# Patient Record
Sex: Male | Born: 1980 | Race: White | Hispanic: No | Marital: Single | Smoking: Current every day smoker
Health system: Southern US, Community
[De-identification: ages and names within clinical notes are randomized; demographics above are authoritative.]

## PROBLEM LIST (undated history)

## (undated) DIAGNOSIS — F329 Major depressive disorder, single episode, unspecified: Secondary | ICD-10-CM

## (undated) DIAGNOSIS — F32A Depression, unspecified: Secondary | ICD-10-CM

## (undated) DIAGNOSIS — F419 Anxiety disorder, unspecified: Secondary | ICD-10-CM

## (undated) HISTORY — DX: Anxiety disorder, unspecified: F41.9

---

## 2003-12-15 ENCOUNTER — Other Ambulatory Visit: Payer: Self-pay

## 2008-12-26 ENCOUNTER — Ambulatory Visit: Payer: Self-pay | Admitting: Internal Medicine

## 2009-08-15 ENCOUNTER — Inpatient Hospital Stay: Payer: Self-pay | Admitting: Unknown Physician Specialty

## 2009-12-26 ENCOUNTER — Emergency Department: Payer: Self-pay | Admitting: Emergency Medicine

## 2011-04-09 ENCOUNTER — Emergency Department: Payer: Self-pay | Admitting: Emergency Medicine

## 2012-12-22 DIAGNOSIS — N411 Chronic prostatitis: Secondary | ICD-10-CM | POA: Insufficient documentation

## 2012-12-22 DIAGNOSIS — K4090 Unilateral inguinal hernia, without obstruction or gangrene, not specified as recurrent: Secondary | ICD-10-CM

## 2012-12-22 DIAGNOSIS — Q602 Renal agenesis, unspecified: Secondary | ICD-10-CM | POA: Insufficient documentation

## 2012-12-22 HISTORY — DX: Unilateral inguinal hernia, without obstruction or gangrene, not specified as recurrent: K40.90

## 2013-01-05 ENCOUNTER — Inpatient Hospital Stay: Payer: Self-pay | Admitting: Psychiatry

## 2013-01-05 LAB — DRUG SCREEN, URINE
Amphetamines, Ur Screen: NEGATIVE (ref ?–1000)
Barbiturates, Ur Screen: NEGATIVE (ref ?–200)
Cannabinoid 50 Ng, Ur ~~LOC~~: POSITIVE (ref ?–50)
Cocaine Metabolite,Ur ~~LOC~~: NEGATIVE (ref ?–300)
Methadone, Ur Screen: NEGATIVE (ref ?–300)
Opiate, Ur Screen: NEGATIVE (ref ?–300)
Phencyclidine (PCP) Ur S: NEGATIVE (ref ?–25)
Tricyclic, Ur Screen: NEGATIVE (ref ?–1000)

## 2013-01-05 LAB — COMPREHENSIVE METABOLIC PANEL
Albumin: 4.1 g/dL (ref 3.4–5.0)
Alkaline Phosphatase: 79 U/L (ref 50–136)
Bilirubin,Total: 0.4 mg/dL (ref 0.2–1.0)
Co2: 27 mmol/L (ref 21–32)
Glucose: 112 mg/dL — ABNORMAL HIGH (ref 65–99)
Osmolality: 276 (ref 275–301)
SGPT (ALT): 22 U/L (ref 12–78)
Sodium: 139 mmol/L (ref 136–145)
Total Protein: 8.1 g/dL (ref 6.4–8.2)

## 2013-01-05 LAB — URINALYSIS, COMPLETE
Bilirubin,UR: NEGATIVE
Glucose,UR: NEGATIVE mg/dL (ref 0–75)
Hyaline Cast: 6
Ketone: NEGATIVE
Nitrite: NEGATIVE
Ph: 5 (ref 4.5–8.0)
Protein: NEGATIVE
RBC,UR: 2 /HPF (ref 0–5)
Squamous Epithelial: NONE SEEN
WBC UR: 6 /HPF (ref 0–5)

## 2013-01-05 LAB — ETHANOL: Ethanol: 127 mg/dL

## 2013-01-05 LAB — CBC
HGB: 17.1 g/dL (ref 13.0–18.0)
Platelet: 182 10*3/uL (ref 150–440)
WBC: 6.5 10*3/uL (ref 3.8–10.6)

## 2013-01-06 LAB — BEHAVIORAL MEDICINE 1 PANEL
Albumin: 3.5 g/dL (ref 3.4–5.0)
Alkaline Phosphatase: 62 U/L (ref 50–136)
Anion Gap: 4 — ABNORMAL LOW (ref 7–16)
BUN: 9 mg/dL (ref 7–18)
Basophil #: 0.1 10*3/uL (ref 0.0–0.1)
Basophil %: 0.9 %
Bilirubin,Total: 0.6 mg/dL (ref 0.2–1.0)
Calcium, Total: 8.5 mg/dL (ref 8.5–10.1)
Chloride: 107 mmol/L (ref 98–107)
Co2: 27 mmol/L (ref 21–32)
Creatinine: 0.84 mg/dL (ref 0.60–1.30)
EGFR (African American): >60
EGFR (Non-African Amer.): >60
Eosinophil #: 0.4 10*3/uL (ref 0.0–0.7)
Eosinophil %: 5.3 %
Glucose: 91 mg/dL (ref 65–99)
HCT: 46.4 % (ref 40.0–52.0)
HGB: 16.3 g/dL (ref 13.0–18.0)
Lymphocyte #: 2.5 10*3/uL (ref 1.0–3.6)
Lymphocyte %: 30.4 %
MCH: 32.6 pg (ref 26.0–34.0)
MCHC: 35.1 g/dL (ref 32.0–36.0)
MCV: 93 fL (ref 80–100)
Monocyte #: 0.5 x10 3/mm (ref 0.2–1.0)
Monocyte %: 6.6 %
Neutrophil #: 4.6 10*3/uL (ref 1.4–6.5)
Neutrophil %: 56.8 %
Osmolality: 274 (ref 275–301)
Platelet: 156 10*3/uL (ref 150–440)
Potassium: 3.6 mmol/L (ref 3.5–5.1)
RBC: 4.99 10*6/uL (ref 4.40–5.90)
RDW: 12.4 % (ref 11.5–14.5)
SGOT(AST): 19 U/L (ref 15–37)
SGPT (ALT): 19 U/L (ref 12–78)
Sodium: 138 mmol/L (ref 136–145)
Thyroid Stimulating Horm: 0.547 u[IU]/mL
Total Protein: 6.8 g/dL (ref 6.4–8.2)
WBC: 8.1 10*3/uL (ref 3.8–10.6)

## 2013-01-14 ENCOUNTER — Ambulatory Visit: Payer: Self-pay | Admitting: Psychiatry

## 2013-01-22 LAB — RAPID URINE DRUG SCREEN, HOSP PERFORMED
Amphetamines, Ur Screen: NEGATIVE (ref ?–1000)
Barbiturates, Ur Screen: NEGATIVE (ref ?–200)
Benzodiazepine, Ur Scrn: POSITIVE (ref ?–200)
Opiate, Ur Screen: NEGATIVE (ref ?–300)

## 2013-02-11 ENCOUNTER — Ambulatory Visit: Payer: Self-pay | Admitting: Psychiatry

## 2013-08-22 ENCOUNTER — Emergency Department: Payer: Self-pay | Admitting: Emergency Medicine

## 2013-10-29 DIAGNOSIS — R350 Frequency of micturition: Secondary | ICD-10-CM | POA: Insufficient documentation

## 2013-10-29 DIAGNOSIS — N50819 Testicular pain, unspecified: Secondary | ICD-10-CM | POA: Insufficient documentation

## 2013-10-30 DIAGNOSIS — Z3169 Encounter for other general counseling and advice on procreation: Secondary | ICD-10-CM | POA: Insufficient documentation

## 2014-08-14 ENCOUNTER — Emergency Department: Payer: Self-pay | Admitting: Emergency Medicine

## 2014-08-14 LAB — BASIC METABOLIC PANEL
ANION GAP: 10 (ref 7–16)
BUN: 8 mg/dL (ref 7–18)
Calcium, Total: 9.1 mg/dL (ref 8.5–10.1)
Chloride: 105 mmol/L (ref 98–107)
Co2: 23 mmol/L (ref 21–32)
Creatinine: 1.03 mg/dL (ref 0.60–1.30)
EGFR (African American): >60
EGFR (Non-African Amer.): >60
Glucose: 92 mg/dL (ref 65–99)
Osmolality: 274 (ref 275–301)
Potassium: 3.7 mmol/L (ref 3.5–5.1)
SODIUM: 138 mmol/L (ref 136–145)

## 2014-08-14 LAB — URINALYSIS, COMPLETE
BACTERIA: NONE SEEN
BILIRUBIN, UR: NEGATIVE
BLOOD: NEGATIVE
Glucose,UR: NEGATIVE mg/dL (ref 0–75)
Ketone: NEGATIVE
Leukocyte Esterase: NEGATIVE
Nitrite: NEGATIVE
PH: 7 (ref 4.5–8.0)
Protein: NEGATIVE
Specific Gravity: 1.006 (ref 1.003–1.030)
Squamous Epithelial: NONE SEEN
WBC UR: 2 /HPF (ref 0–5)

## 2014-08-14 LAB — CBC
HCT: 51.3 % (ref 40.0–52.0)
HGB: 17.6 g/dL (ref 13.0–18.0)
MCH: 32.6 pg (ref 26.0–34.0)
MCHC: 34.3 g/dL (ref 32.0–36.0)
MCV: 95 fL (ref 80–100)
Platelet: 201 10*3/uL (ref 150–440)
RBC: 5.4 10*6/uL (ref 4.40–5.90)
RDW: 12.2 % (ref 11.5–14.5)
WBC: 8.1 10*3/uL (ref 3.8–10.6)

## 2014-08-14 LAB — TROPONIN I

## 2015-02-03 NOTE — Consult Note (Signed)
PATIENT NAME:  Brett [name], Brett [name] MR#:  [id number] DATE OF BIRTH:  06/15/1981  DATE OF CONSULTATION:  01/05/2013  CONSULTING PHYSICIAN:  Rosangela Fehrenbach S. Garnetta BuddyFaheem, MD  REASON FOR CONSULTATION: Depression.   HISTORY OF PRESENT ILLNESS: The patient is a 34 year old white male who presented to the ED stating that he feels like he is going to self-destruct soon. The patient has Latimore history of using alcohol and he has been binge drinking for almost 12 years. He reported that he has been addicted to alcohol. He consumed 12 packs of beer per day. He reported that he has been using alcohol to control his depressive symptoms. He reported that he is a "high functioning alcoholic." He reported that when he does not drink, he starts getting shakes, stomach problem, and dehydration. 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  After he comes back home, he starts drinking again. The patient reported that he also has history of depression with symptoms including depressed mood, feelings of hopelessness, helplessness increased guilt, decreased energy, decreased concentration.  The patient reported that he was having suicidal thoughts, but was not having any plans. The patient reported that he was also using marijuana, using up to 2 joints per day for the past several years but he quit using marijuana one week ago. He has been smoking for the past 12 years. He decided that he needs to stand up for his children. The patient reported that he has 2 children, ages  35 months as well as a 35-year-old from his previous relationship. He decided that he needed to come in for help at this time. The patient reported that he does not have any history of blackouts, seizures as well as shakiness. He reported that he wants help at this time for his depressive symptoms as well as for his alcohol use. During his previous hospitalization he was diagnosed with bipolar disorder and was given medication, but he remains noncompliant.    PAST PSYCHIATRIC HISTORY: The patient reported that he was admitted to the [facility] inpatient unit 2[facility] years ago. At that time he was given Wellbutrin, but he took the medication only for a couple of months. He was also referred to [facility] but he stated that he did not go there. The patient stated that he has never been to the rehab.   FAMILY HISTORY: The patient reported that his mother has history of bipolar disorder and she takes Zyprexa at this time. All his uncles are alcoholic. He denied any history of suicide in his family.   PAST MEDICAL HISTORY: The patient was born with 1 kidney. He reported that he gets hives. He has responded to Zyprexa in the past.   SOCIAL HISTORY: The patient has completed 2 years of college. He currently lives with his fiance and 2 children. He has his daughter who is 35 years old from the first relationship. He is also has a 5172-month-old. He has been working at [employer] for the past couple of years and works for 60+ hours. He feels that he enjoys his job. He has recently stopped using marijuana. He smokes one half to 1 pack of cigarettes per day.    REVIEW OF SYSTEMS:   CONSTITUTIONAL: No fever or chills. No weight changes.  EYES: No blurred or double vision.  RESPIRATORY: No shortness of breath or cough.  CARDIOVASCULAR: No chest pain, orthopnea.  GASTROINTESTINAL: No abdominal pain, nausea, vomiting or diarrhea.  GENITOURINARY: No incontinence or frequency.  ENDOCRINE: No heat or cold intolerance.  LYMPHATIC: No anemia or easy bruising.  INTEGUMENTARY: No acne or rash.  MUSCULOSKELETAL: No muscle or joint pain.   MENTAL STATUS EXAMINATION: The patient is a moderately built male who appeared his stated age. He was calm and cooperative. He maintained fair eye contact. His mood was depressed. Affect was congruent. Thought process was logical, goal-directed. Thought content was non-delusional. He currently denied having any suicidal ideations or plans.  VITAL SIGNS:   Temperature 98.2, pulse 91, respirations 20, blood pressure 144/63.  ANCILLARY DATA:  Glucose 112, BUN 7, creatinine 1.04, sodium 139, potassium 3.6, chloride 106, bicarbonate 27, anion gap 6, blood alcohol level 127, protein 8.1, albumin 4.1, bilirubin 0.4, AST 17, ALT 22, TSH 1.5.  Urine drug screen positive for benzodiazepines and cannabinoids. WBC 6.5, RBC 5.26, hemoglobin 17.1, MCV 93, MCH 32.6.   DIAGNOSTIC IMPRESSION: AXIS I:   Bipolar disorder, not otherwise specified.    Alcohol dependence and alcohol withdrawal.  AXIS II:  None.  AXIS III:  The patient has one kidney.  AXIS IV:  Severe. AXIS V: Current global assessment of functioning: 25.  TREATMENT PLAN: 1.  The patient is willing to be admitted voluntarily for the treatment of depression and alcohol 2.  withdrawal.  2.  I will start him on CIWA protocol.  3.  He will also be given medications for depression once he becomes clinically stable. 4.  The patient agreed with the plan. He is willing to take any medication to control his mood symptoms.  5.  Treatment team to follow.   Thank you for allowing me to participate in the care of this patient.     ____________________________ Ardeen Fillers. Garnetta Buddy, MD usf:ct D: 01/05/2013 14:22:57 ET T: 01/05/2013 14:31:48 ET JOB#: [id number]  cc: Ardeen Fillers. Garnetta Buddy, MD, <Dictator> Rhunette Croft MD ELECTRONICALLY SIGNED 01/14/2013 8:50

## 2015-02-03 NOTE — Discharge Summary (Signed)
PATIENT NAME:  Brett [name], Brett [name] MR#:  [id number] DATE OF BIRTH:  05/10/1981  DATE OF ADMISSION:  01/05/2013 ANTICIPATED DISCHARGE:  01/08/2013  HOSPITAL COURSE:  See dictated history and physical for details of admission. This is a 34 year old man with a history of alcohol dependence and bipolar disorder presented himself to the hospital requesting detox, giving a history of having been drinking very heavily nonstop for months at a time and really never having had any sustained sobriety. Additionally, he has a Tomer history of what sounds like an almost constant hypomania, but with some ups and downs to it. The patient felt like his life was out of control and needed some assistance. In the hospital, he was treated for alcohol withdrawal and really showed minimal to no signs of serious alcohol withdrawal. He had no tremor. His vital signs remained largely stable. There was no sign of delirium. He was restarted on Depakote 750 mg at night because of his previous treatment for this for his diagnosis of bipolar disorder. He tolerated this medicine well with no difficulty and is agreeable to continuing with that. We have made arrangements for him to be accepted into a [facility] substance abuse treatment facility in [place]. I have completed short-term disability paperwork for the patient to allow him to get some time off work. The patient has met with his family and discussed this plan with them. He appears to be sincere about it. He has gone to groups today and participated well. At this time we will plan to continue the current medicine but discharge him tomorrow to be transported by his family to [place]. Education done regarding his diagnoses and appropriate treatment and patient understands and is agreeable to treatment.   DISCHARGE MEDICATIONS:  Depakote 750 mg p.o. at bedtime.   LABORATORY RESULTS:  Admission labs showed drug screen positive for benzodiazepines and cannabis. TSH normal at 1.3, alcohol 127.  Chemistry showed elevated glucose 112, otherwise normal with normal liver function tests. CBC was entirely normal. Urinalysis 1+ leukocyte esterase, mixed cells.   MENTAL STATUS EXAM AT DISCHARGE:  Neatly dressed and groomed man looks his stated age, pleasant and cooperative in the interview. Good eye contact, normal psychomotor activity. Speech slightly loud and slightly elevated in rate, but not pressured. Thoughts appear lucid. No evidence of loosening of associations or delusional thinking. Denies auditory or visual hallucinations. Denies suicidal or homicidal ideation. Short and Spellman-term memory grossly intact. Insight improved. Judgment improved.  Intelligence normal.   DISPOSITION:  Discharge tomorrow to follow up with [place] [place] substance abuse program.   DIAGNOSIS PRINCIPAL AND PRIMARY:   AXIS I:  Alcohol dependence.   SECONDARY DIAGNOSIS: AXIS I: Bipolar disorder type II.  AXIS II:  No diagnosis.   AXIS III:  History of prostatitis, alcohol withdrawal.   AXIS IV:  Moderate from the effect of his drinking on his life.   AXIS V:  Functioning at time of discharge 60.     ____________________________ Gonzella Lex, MD jtc:ce D: 01/07/2013 16:42:28 ET T: 01/07/2013 17:10:50 ET JOB#: [id number]  cc: Gonzella Lex, MD, <Dictator> Gonzella Lex MD ELECTRONICALLY SIGNED 01/11/2013 0:34

## 2015-02-03 NOTE — H&P (Signed)
DATE OF BIRTH:  21-Nov-1980  DATE OF EVALUATION: 01/06/2013  IDENTIFYING INFORMATION AND CHIEF COMPLAINT: A 34 year old man with a history of alcohol dependence and bipolar disorder, came to the hospital seeking detox.   CHIEF COMPLAINT:  "I just was fed up with it."   HISTORY OF PRESENT ILLNESS:  Information obtained from the patient and the chart. The patient reports that he has been drinking heavily for years, at a rate often of 18 or more beers or the equivalent per day. He also smokes marijuana frequently. He is aware that it has been disrupting his life, ruining his relationship with his ex-wife, making it difficult for him to function socially, making his mood more labile, and he has decided that he wants to stop. On top of that, the patient has a diagnosis of bipolar disorder, which appears to be separate from his substance abuse. He says that he is aware that he has major seasonal swings in his mood with frequent periods of hypomania, during which he can engage in disruptive behavior, and he wants to get something done about that as well. He denies that he is having any suicidal ideation. Denies homicidal ideation. He is not reporting acute psychotic symptoms. The patient had not been getting any kind of outpatient mental health treatment. Major stresses in his life include going through a current divorce with a custody battle, which is very unpleasant for him.   PAST PSYCHIATRIC HISTORY:  One previous psychiatric hospitalization at our hospital in 2010, at which time he was diagnosed with bipolar disorder. He was started on Depakote, but did not continue to take it after leaving the hospital. He also did not maintain any kind of sobriety after that hospitalization. Denies any history of suicide attempts or interpersonal violence.   PAST MEDICAL HISTORY: The patient does not report any significant ongoing medical problems other than prostatitis, which is currently completely under control and  which he does not get any treatment for her.   SOCIAL HISTORY:  Lives with his fiance and a 18-year-old child. He has another child who is age 77, who he shares custody of with his ex-wife. They are going through a current legal custody battle, apparently. 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   FAMILY HISTORY:  He says there are several people in his family with bipolar disorder and substance abuse problems.   SUBSTANCE ABUSE HISTORY:  Has been drinking heavily for many years. He has no history of seizures or delirium tremens. Also smokes marijuana very frequently, last used about 10 days ago. No history of sustained sobriety or engaging in any kind of outpatient treatment.   REVIEW OF SYSTEMS:  Mood is feeling anxious. Denies hallucinations. Denies suicidal or homicidal ideation. Not currently having tremors. Mood is feeling adequate. No suicidal ideation. No psychotic symptoms.   CURRENT MEDICATIONS:  None.   ALLERGIES:  PENICILLIN.   MENTAL STATUS EXAM:  A casually dressed, neatly groomed man who looks his stated age. Cooperative and pleasant in the interview. Good eye contact, normal psychomotor activity. Speech is normal in rate, tone and volume. Affect euthymic, reactive and appropriate. Mood stated as being better. Thoughts are lucid, without loosening of associations. No evidence of delusional thinking. Denies auditory or visual hallucinations. Denies suicidal or homicidal ideation. Judgment and insight are currently intact. Short and Dyches-term memory grossly intact.   PHYSICAL EXAMINATION: GENERAL:  The patient does not appear to be in any acute distress.  SKIN:  No skin lesions identified.  HEENT:  Pupils equal and reactive. Face symmetric.  MUSCULOSKELETAL: Full range of motion at all extremities. Gait normal. Strength and reflexes normal throughout.  LUNGS:  Clear, with no wheezes.  HEART:  Regular rate and rhythm.  ABDOMEN:  Soft, nontender, normal  bowel sounds.  VITAL SIGNS:  Most recent blood pressure 134/81, respirations 18, pulse 79, temperature 98.2.   LABORATORY RESULTS:  Admission labs showed a drug screen positive for cannabis and benzodiazepines. TSH normal at 1.5. Alcohol level on admission 127. Glucose slightly elevated at 112, but the rest of the chemistry is normal. CBC normal. Urinalysis unremarkable.   ASSESSMENT:  A 34 year old man with alcohol dependence and bipolar disorder, currently presenting hypomanic but not psychotic, and in need of alcohol withdrawal.   TREATMENT PLAN:  The patient is currently getting standing doses of Librium. These will be tapered. Meanwhile, he will be engaged in substance abuse groups as well as other appropriate treatment programming. Daily individual counseling and education as well. We discussed medication for his bipolar disorder, and agreed to restart Depakote. The patient has been educated about the importance of actually staying on medication and staying active in treatment, and he does express an understanding of this. He is already discussing with the treatment team possibilities for further care, including the intensive outpatient program or going to a rehab program.   DIAGNOSIS, PRINCIPAL AND PRIMARY:  AXIS I:  Alcohol dependence.   SECONDARY DIAGNOSES: AXIS I: Bipolar disorder, type II, hypomanic.  Marijuana abuse.   AXIS II:  Deferred.   AXIS III:  Prostatitis, in remission.   AXIS IV:  Moderate stress from his divorce.   AXIS V:  Functioning at time of evaluation:  40.   ____________________________ Audery AmelJohn T. Eiko Mcgowen, MD jtc:mr D: 01/06/2013 19:17:53 ET T: 01/06/2013 19:39:04 ET JOB#: [id number]  cc: Audery AmelJohn T. Maizee Reinhold, MD, <Dictator> Audery AmelJOHN T Tyreak Reagle MD ELECTRONICALLY SIGNED 01/06/2013 23:32

## 2015-02-09 ENCOUNTER — Ambulatory Visit
Admit: 2015-02-09 | Disposition: A | Discharge status: Home / Self Care | Payer: Self-pay | Attending: Family Medicine | Admitting: Family Medicine

## 2015-02-09 LAB — RAPID STREP-A WITH REFLX: Micro Text Report: POSITIVE

## 2015-04-13 ENCOUNTER — Encounter: Payer: Self-pay | Admitting: Emergency Medicine

## 2015-04-13 ENCOUNTER — Ambulatory Visit
Admission: EM | Admit: 2015-04-13 | Discharge: 2015-04-13 | Disposition: A | Discharge status: Home / Self Care | Payer: Self-pay | Attending: Emergency Medicine | Admitting: Emergency Medicine

## 2015-04-13 DIAGNOSIS — L03115 Cellulitis of right lower limb: Secondary | ICD-10-CM

## 2015-04-13 DIAGNOSIS — J069 Acute upper respiratory infection, unspecified: Secondary | ICD-10-CM

## 2015-04-13 MED ORDER — MUPIROCIN CALCIUM 2 % NA OINT: TOPICAL_OINTMENT | NASAL | Status: DC

## 2015-04-13 MED ORDER — FLUTICASONE PROPIONATE 50 MCG/ACT NA SUSP: 2.0000 | Freq: Every day | NASAL | Status: DC

## 2015-04-13 MED ORDER — DOXYCYCLINE HYCLATE 100 MG PO CAPS: 100.0000 mg | ORAL_CAPSULE | Freq: Two times a day (BID) | ORAL | Status: DC

## 2015-04-13 MED ORDER — PSEUDOEPHEDRINE-GUAIFENESIN ER 120-1200 MG PO TB12: 1.0000 | ORAL_TABLET | Freq: Two times a day (BID) | ORAL | Status: DC

## 2015-04-13 NOTE — Discharge Instructions (Signed)
Take the medication as written. Take 1 gram of tylenol with  800 mg motrin up to 3 times a day as needed for pain and fever. This is an effective combination. Drink extra fluids. Start taking the mucinex to keep the mucus secretions thin. Use a neti pot or the NeilMed sinus rinse as often as you want to to reduce nasal congestion. Follow the directions on the box.  Go to www.goodrx.com to look up your medications. This will give you a list of where you can find your prescriptions at the most affordable prices.   Apply the Bactroban in your nose to eliminate any MRSA that might be living there. This is not completely necessary, doxycycline is the most important prescription for your abscesses. Use Hibiclens/chlorhexidine soap to wash the areas that are infected. Return if you get worse, have a persistent fever >100.4, or for any concerns.

## 2015-04-13 NOTE — ED Notes (Signed)
Pt reports cold symptoms, nasal congestion and drainage, sore throat started yesterday afternoon. Abscesses x2 on R thigh started about 3 days ago. And another abscess on on R buttock is going away.

## 2015-04-13 NOTE — ED Provider Notes (Signed)
HPI  SUBJECTIVE:  Brett [name] is a 34 y.o. male who presents with 2 complaints: First, he reports nasal congestion with postnasal drip, sore throat, nonproductive cough starting yesterday. He thinks that this is a cold. Symptoms are better with Tylenol Cold no aggravating symptoms . he has not tried anything else for this. No fevers, chest pain, wheezing, shortness of breath, allergy symptoms. No sinus pain/pressure, purulent nasal drainage, dental pain, facial swelling. Patient states that he has a history of seasonal allergies, sinus infections.  Second, patient reports recurrent lesion/boils primarily on his lower extremities for the past month. He states it starts off as erythema, becomes pruritic, then becomes painful and "hard". States that it drains on its own around day 3, and completely resolves in a week. He denies nausea, vomiting, fevers, sensation being bitten at night, blood on the bed clothes in the morning, other trauma to these areas. No contacts with similar lesions. No aggravating or alleviating factors. He has not tried anything for this. Past medical history negative for diabetes, MRSA.    No past medical history on file.  No past surgical history on file.  History reviewed. No pertinent family history.  History  Substance Use Topics  . Smoking status: Current Every Day Smoker -- 1.00 packs/day    Types: Cigarettes  . Smokeless tobacco: Not on file  . Alcohol Use: Yes    No current facility-administered medications for this encounter.  Current outpatient prescriptions:  .  doxycycline (VIBRAMYCIN) 100 MG capsule, Take 1 capsule (100 mg total) by mouth 2 (two) times daily., Disp: 20 capsule, Rfl: 0 .  fluticasone (FLONASE) 50 MCG/ACT nasal spray, Place 2 sprays into both nostrils daily., Disp: 16 g, Rfl: 0 .  mupirocin nasal ointment (BACTROBAN) 2 %, Apply in each nostril daily x 5 days, Disp: 10 g, Rfl: 0 .  Pseudoephedrine-Guaifenesin (MUCINEX D) [phone]  MG TB12, Take 1 tablet by mouth 2 (two) times daily., Disp: 20 each, Rfl: 0  Allergies  Allergen Reactions  . Penicillins Other (See Comments)    Unknown, happened as a baby      ROS  As noted in HPI.   Physical Exam  BP 144/87 mmHg  Pulse 86  Temp(Src) 97.8 F (36.6 C) (Oral)  Resp 18  Ht  (1.753 m)  Wt 190 lb (86.183 kg)  BMI 28.05 kg/m2  SpO2 100%  Constitutional: Well developed, well nourished, no acute distress Eyes:  EOMI, conjunctiva normal bilaterally HENT: Normocephalic, atraumatic,mucus membranes moist.  Respiratory: Normal inspiratory effort Cardiovascular: Normal rate GI: nondistended skin: 9 x 10 cm area of erythema right medial upper thigh with a 2 x 3 cm area of induration with central scab noted. No Drainage no fluctuance. There is nothing to I&D. Musculoskeletal: no deformities Neurologic: Alert & oriented x 3, no focal neuro deficits Psychiatric: Speech and behavior appropriate   ED Course   Medications - No data to display  No orders of the defined types were placed in this encounter.    No results found for this or any previous visit (from the past 24 hour(s)). No results found.  ED Clinical Impression  URI (upper respiratory infection)  Cellulitis of right lower extremity  ED Assessment/Plan 1. Upper respiratory infection. Mucinex D, Flonase, saline nasal irrigation. No indications for antibiotics at this time  2: Recurrent skin infections/cellulitis. There is not an abscess at this time. 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 Home with Hibiclens, doxycycline, Bactroban as patient is likely a carrier.  Discussed MDM, plan and followup with PMD as needed with patient . Discussed sn/sx that should prompt return to the UC or ED. Patient agrees with plan.   *This clinic note was created using Dragon dictation software. Therefore, there may be occasional mistakes despite careful proofreading.  ?   Domenick GongAshley Markeith Jue, MD 04/13/15 1725

## 2015-05-05 ENCOUNTER — Other Ambulatory Visit: Payer: Self-pay

## 2015-05-05 ENCOUNTER — Emergency Department: Payer: Self-pay

## 2015-05-05 ENCOUNTER — Emergency Department
Admission: EM | Admit: 2015-05-05 | Discharge: 2015-05-05 | Disposition: A | Discharge status: Home / Self Care | Payer: Self-pay | Attending: Emergency Medicine | Admitting: Emergency Medicine

## 2015-05-05 ENCOUNTER — Encounter: Payer: Self-pay | Admitting: *Deleted

## 2015-05-05 DIAGNOSIS — R079 Chest pain, unspecified: Secondary | ICD-10-CM | POA: Insufficient documentation

## 2015-05-05 DIAGNOSIS — Z720 Tobacco use: Secondary | ICD-10-CM | POA: Insufficient documentation

## 2015-05-05 DIAGNOSIS — Z79899 Other long term (current) drug therapy: Secondary | ICD-10-CM | POA: Insufficient documentation

## 2015-05-05 DIAGNOSIS — Z7951 Long term (current) use of inhaled steroids: Secondary | ICD-10-CM | POA: Insufficient documentation

## 2015-05-05 DIAGNOSIS — R200 Anesthesia of skin: Secondary | ICD-10-CM | POA: Insufficient documentation

## 2015-05-05 DIAGNOSIS — F419 Anxiety disorder, unspecified: Secondary | ICD-10-CM | POA: Insufficient documentation

## 2015-05-05 DIAGNOSIS — R197 Diarrhea, unspecified: Secondary | ICD-10-CM | POA: Insufficient documentation

## 2015-05-05 DIAGNOSIS — Z792 Long term (current) use of antibiotics: Secondary | ICD-10-CM | POA: Insufficient documentation

## 2015-05-05 DIAGNOSIS — Z880 Allergy status to penicillin: Secondary | ICD-10-CM | POA: Insufficient documentation

## 2015-05-05 LAB — CBC
HCT: 48.7 % (ref 40.0–52.0)
Hemoglobin: 16.9 g/dL (ref 13.0–18.0)
MCH: 32.8 pg (ref 26.0–34.0)
MCHC: 34.8 g/dL (ref 32.0–36.0)
MCV: 94.1 fL (ref 80.0–100.0)
Platelets: 203 10*3/uL (ref 150–440)
RBC: 5.17 MIL/uL (ref 4.40–5.90)
RDW: 12.5 % (ref 11.5–14.5)
WBC: 8.3 10*3/uL (ref 3.8–10.6)

## 2015-05-05 LAB — BASIC METABOLIC PANEL
ANION GAP: 8 (ref 5–15)
BUN: 10 mg/dL (ref 6–20)
CALCIUM: 9.2 mg/dL (ref 8.9–10.3)
CO2: 28 mmol/L (ref 22–32)
CREATININE: 1 mg/dL (ref 0.61–1.24)
Chloride: 102 mmol/L (ref 101–111)
GFR calc Af Amer: >60 mL/min (ref >60)
GFR calc non Af Amer: >60 mL/min (ref >60)
Glucose, Bld: 93 mg/dL (ref 65–99)
Potassium: 4.1 mmol/L (ref 3.5–5.1)
SODIUM: 138 mmol/L (ref 135–145)

## 2015-05-05 LAB — TROPONIN I

## 2015-05-05 NOTE — ED Provider Notes (Signed)
[facility] Emergency Department Provider Note  ____________________________________________  Time seen: Approximately 330 PM  I have reviewed the triage vital signs and the nursing notes.   HISTORY  Chief Complaint Fatigue    HPI Brett [name] is a 34 y.o. male with a history of anxiety and panic attacks who presents today with numbness to his bilateral forearms, an episode of chest pain 3 days ago, and soft stools. He says that while driving 3 days ago he had a sharp pain to the center of his chest that lasted 2-3 minutes. There was no associated shortness of breath, nausea vomiting or diaphoresis. The pain has resolved and he has not had any chest pain since. However, one day after he noticed left forearm and right forearm numbness and tingling. He says that the numbness in his left arm is worse than on the right. He does note a history of heart attack and stroke in his family. Does not have a primary care doctor at this time but does have an appointment established for August 17 in [facility].    Patient also says has bowel movements that a "loose" over the past 3 months. No recent antibiotics or foreign travel. No blood in the stool. Having up to 5-6 bowel movements per day.  The patient says that he has been under a considerable amount of stress for the past year. However, says that he was not feeling any particular increase in her stress during the episode of chest pain.    History reviewed. No pertinent past medical history.  There are no active problems to display for this patient.   History reviewed. No pertinent past surgical history.  Current Outpatient Rx  Name  Route  Sig  Dispense  Refill  . loratadine (CLARITIN) 10 MG tablet   Oral   Take 10 mg by mouth daily as needed for allergies.         Marland Kitchen doxycycline (VIBRAMYCIN) 100 MG capsule   Oral   Take 1 capsule (100 mg total) by mouth 2 (two) times daily. Patient not taking: Reported  on 05/05/2015   20 capsule   0   . fluticasone (FLONASE) 50 MCG/ACT nasal spray   Each Nare   Place 2 sprays into both nostrils daily. Patient not taking: Reported on 05/05/2015   16 g   0   . mupirocin nasal ointment (BACTROBAN) 2 %      Apply in each nostril daily x 5 days Patient not taking: Reported on 05/05/2015   10 g   0   . Pseudoephedrine-Guaifenesin (MUCINEX D) 972 [id number] MG TB12   Oral   Take 1 tablet by mouth 2 (two) times daily. Patient not taking: Reported on 05/05/2015   20 each   0     Allergies Penicillins  No family history on file.  Social History History  Substance Use Topics  . Smoking status: Current Every Day Smoker -- 1.00 packs/day    Types: Cigarettes  . Smokeless tobacco: Not on file  . Alcohol Use: Yes    Review of Systems Constitutional: No fever/chills Eyes: No visual changes. ENT: No sore throat. Cardiovascular:  as above Respiratory: Denies shortness of breath. Gastrointestinal: No abdominal pain.  No nausea, no vomiting.  No diarrhea.  No constipation. Genitourinary: Negative for dysuria. Musculoskeletal: Negative for back pain. Skin: Negative for rash. Neurological: Negative for headaches, focal weakness  10-point ROS otherwise negative.  ____________________________________________   PHYSICAL EXAM:  VITAL SIGNS: ED Triage  Vitals  Enc Vitals Group     BP 05/05/15 1405 152/93 mmHg     Pulse Rate 05/05/15 1405 94     Resp 05/05/15 1405 18     Temp 05/05/15 1405 98.7 F (37.1 C)     Temp Source 05/05/15 1405 Oral     SpO2 05/05/15 1405 100 %     Weight 05/05/15 1405 195 lb (88.451 kg)     Height 05/05/15 1405 5\' 10"  (1.778 m)     Head Cir --      Peak Flow --      Pain Score 05/05/15 1409 2     Pain Loc --      Pain Edu? --      Excl. in GC? --     Constitutional: Alert and oriented. Well appearing and in no acute distress. Eyes: Conjunctivae are normal. PERRL. EOMI. Head: Atraumatic. Nose: No  congestion/rhinnorhea. Mouth/Throat: Mucous membranes are moist.  Oropharynx non-erythematous. Neck: No stridor.   Cardiovascular: Normal rate, regular rhythm. Grossly normal heart sounds.  Good peripheral circulation. Respiratory: Normal respiratory effort.  No retractions. Lungs CTAB. Gastrointestinal: Soft and nontender. No distention. No abdominal bruits. No CVA tenderness. Musculoskeletal: No lower extremity tenderness nor edema.  No joint effusions. Neurologic:  Normal speech and language. Strength 5 out of 5 throughout. Cranial nerves all intact. Says has decreased sensation to light touch over the left forearm on both the volar and dorsal surfaces. Bilateral dorsalis pedis as well as radial pulses are intact and equal bilaterally.  No gait instability. Skin:  Skin is warm, dry and intact. No rash noted. Psychiatric: Mood and affect are normal. Speech and behavior are normal.  ____________________________________________   LABS (all labs ordered are listed, but only abnormal results are displayed)  Labs Reviewed  BASIC METABOLIC PANEL  CBC  TROPONIN I   ____________________________________________  EKG  ED ECG REPORT I, Schaevitz,  Teena Irani, the attending physician, personally viewed and interpreted this ECG.   Date: 05/05/2015  EKG Time: 1540  Rate: 66  Rhythm: normal sinus rhythm  Axis: Normal axis  Intervals:none  ST&T Change: No ST elevations or depressions. No abnormal T-wave inversions.  ____________________________________________  RADIOLOGY  No acute intracranial abnormalities. No active cardiopulmonary disease. I personally reviewed these images. ____________________________________________   PROCEDURES    ____________________________________________   INITIAL IMPRESSION / ASSESSMENT AND PLAN / ED COURSE  Pertinent labs & imaging results that were available during my care of the patient were reviewed by me and considered in my medical decision  making (see chart for details). No C. difficile risk factors.  ----------------------------------------- 5:03 PM on 05/05/2015 -----------------------------------------  Patient resting comfortably at this time. Family member at bedside. Family member says that there is a severe family history of anxiety. Symptoms could very well be related to anxiety. Low risk for cardiopulmonary cause. PERC negative. Patient will call the primary care doctor that he is following up with and see if he can have his appointment moved up within 1 week. ____________________________________________   FINAL CLINICAL IMPRESSION(S) / ED DIAGNOSES  Acute chest pain. Acute forearm numbness. Acute diarrhea. Initial visit.    Myrna Blazer, MD 05/05/15 [phone]

## 2015-05-05 NOTE — ED Notes (Signed)
2-3 days ago had episode of chest pain, since has lower arm numbness/soreness, also c/o constipation

## 2016-01-04 ENCOUNTER — Encounter: Payer: Self-pay | Admitting: Emergency Medicine

## 2016-01-04 ENCOUNTER — Emergency Department
Admission: EM | Admit: 2016-01-04 | Discharge: 2016-01-05 | Disposition: A | Discharge status: Other Acute Inpatient Hospital | Payer: Self-pay | Attending: Emergency Medicine | Admitting: Emergency Medicine

## 2016-01-04 DIAGNOSIS — F329 Major depressive disorder, single episode, unspecified: Secondary | ICD-10-CM

## 2016-01-04 DIAGNOSIS — F17210 Nicotine dependence, cigarettes, uncomplicated: Secondary | ICD-10-CM | POA: Insufficient documentation

## 2016-01-04 DIAGNOSIS — F332 Major depressive disorder, recurrent severe without psychotic features: Secondary | ICD-10-CM

## 2016-01-04 DIAGNOSIS — F3181 Bipolar II disorder: Secondary | ICD-10-CM | POA: Diagnosis present

## 2016-01-04 DIAGNOSIS — Z880 Allergy status to penicillin: Secondary | ICD-10-CM | POA: Insufficient documentation

## 2016-01-04 DIAGNOSIS — F3130 Bipolar disorder, current episode depressed, mild or moderate severity, unspecified: Secondary | ICD-10-CM | POA: Insufficient documentation

## 2016-01-04 DIAGNOSIS — R45851 Suicidal ideations: Secondary | ICD-10-CM

## 2016-01-04 DIAGNOSIS — F1210 Cannabis abuse, uncomplicated: Secondary | ICD-10-CM | POA: Insufficient documentation

## 2016-01-04 DIAGNOSIS — F32A Depression, unspecified: Secondary | ICD-10-CM

## 2016-01-04 HISTORY — DX: Depression, unspecified: F32.A

## 2016-01-04 HISTORY — DX: Major depressive disorder, single episode, unspecified: F32.9

## 2016-01-04 LAB — URINE DRUG SCREEN, QUALITATIVE (ARMC ONLY)
AMPHETAMINES, UR SCREEN: NOT DETECTED
Barbiturates, Ur Screen: NOT DETECTED
Benzodiazepine, Ur Scrn: NOT DETECTED
Cannabinoid 50 Ng, Ur ~~LOC~~: POSITIVE — AB
Cocaine Metabolite,Ur ~~LOC~~: NOT DETECTED
MDMA (Ecstasy)Ur Screen: NOT DETECTED
Methadone Scn, Ur: NOT DETECTED
OPIATE, UR SCREEN: NOT DETECTED
PHENCYCLIDINE (PCP) UR S: NOT DETECTED
Tricyclic, Ur Screen: NOT DETECTED

## 2016-01-04 LAB — CBC
HEMATOCRIT: 52.6 % — AB (ref 40.0–52.0)
Hemoglobin: 18.5 g/dL — ABNORMAL HIGH (ref 13.0–18.0)
MCH: 33.5 pg (ref 26.0–34.0)
MCHC: 35.1 g/dL (ref 32.0–36.0)
MCV: 95.5 fL (ref 80.0–100.0)
Platelets: 180 10*3/uL (ref 150–440)
RBC: 5.51 MIL/uL (ref 4.40–5.90)
RDW: 12.8 % (ref 11.5–14.5)
WBC: 14.4 10*3/uL — ABNORMAL HIGH (ref 3.8–10.6)

## 2016-01-04 LAB — COMPREHENSIVE METABOLIC PANEL
ALBUMIN: 4.8 g/dL (ref 3.5–5.0)
ALT: 18 U/L (ref 17–63)
AST: 28 U/L (ref 15–41)
Alkaline Phosphatase: 72 U/L (ref 38–126)
Anion gap: 14 (ref 5–15)
BILIRUBIN TOTAL: 1.3 mg/dL — AB (ref 0.3–1.2)
BUN: 12 mg/dL (ref 6–20)
CO2: 18 mmol/L — ABNORMAL LOW (ref 22–32)
Calcium: 9 mg/dL (ref 8.9–10.3)
Chloride: 104 mmol/L (ref 101–111)
Creatinine, Ser: 1.15 mg/dL (ref 0.61–1.24)
GFR calc Af Amer: >60 mL/min (ref >60)
GFR calc non Af Amer: >60 mL/min (ref >60)
GLUCOSE: 66 mg/dL (ref 65–99)
POTASSIUM: 3.8 mmol/L (ref 3.5–5.1)
Sodium: 136 mmol/L (ref 135–145)
TOTAL PROTEIN: 8.1 g/dL (ref 6.5–8.1)

## 2016-01-04 LAB — RAPID HIV SCREEN (HIV 1/2 AB+AG)
HIV 1/2 ANTIBODIES: NONREACTIVE
HIV-1 P24 Antigen - HIV24: NONREACTIVE

## 2016-01-04 LAB — CHLAMYDIA/NGC RT PCR (ARMC ONLY)
Chlamydia Tr: NOT DETECTED
N gonorrhoeae: NOT DETECTED

## 2016-01-04 LAB — SALICYLATE LEVEL: Salicylate Lvl: <4 mg/dL (ref 2.8–30.0)

## 2016-01-04 LAB — ETHANOL: Alcohol, Ethyl (B): 139 mg/dL — ABNORMAL HIGH (ref <5)

## 2016-01-04 LAB — ACETAMINOPHEN LEVEL: Acetaminophen (Tylenol), Serum: <10 ug/mL — ABNORMAL LOW (ref 10–30)

## 2016-01-04 MED ORDER — CHLORDIAZEPOXIDE HCL 10 MG PO CAPS
10.0000 mg | ORAL_CAPSULE | Freq: Once | ORAL | Status: DC
  Administered 2016-01-04: 10 mg via ORAL
  Filled 2016-01-04: qty 1

## 2016-01-04 MED ORDER — NICOTINE 14 MG/24HR TD PT24
14.0000 mg | MEDICATED_PATCH | Freq: Every day | TRANSDERMAL | Status: DC
  Administered 2016-01-04 – 2016-01-05 (×2): 14 mg via TRANSDERMAL
  Filled 2016-01-04 (×2): qty 1

## 2016-01-04 MED ORDER — DIVALPROEX SODIUM 500 MG PO DR TAB
500.0000 mg | DELAYED_RELEASE_TABLET | Freq: Every day | ORAL | Status: DC
  Administered 2016-01-04: 500 mg via ORAL
  Filled 2016-01-04: qty 1

## 2016-01-04 MED ORDER — TRAZODONE HCL 50 MG PO TABS
50.0000 mg | ORAL_TABLET | Freq: Every day | ORAL | Status: DC
  Administered 2016-01-04: 50 mg via ORAL
  Filled 2016-01-04: qty 1

## 2016-01-04 NOTE — ED Notes (Signed)
BEHAVIORAL HEALTH ROUNDING Patient sleeping: No. Patient alert and oriented: yes Behavior appropriate: Yes.  ; If no, describe:  Nutrition and fluids offered: Yes  Toileting and hygiene offered: Yes  Sitter present: yes Law enforcement present: Yes  

## 2016-01-04 NOTE — ED Notes (Signed)
Pt. Was given a snack of sandwich and beverage.

## 2016-01-04 NOTE — ED Provider Notes (Addendum)
[facility] Emergency Department Provider Note  ____________________________________________  Time seen: Approximately 3:58 PM  I have reviewed the triage vital signs and the nursing notes.   HISTORY  Chief Complaint Suicidal    HPI Brett [name] is a 35 y.o. male complaining of depression and suicidal ideations. Patient denies any recent medical illnesses. Patient also has history of alcohol abuse in the past. They patient is suicidal with a plan. Patient denies any fever, chills, nausea, vomiting, change in his bowels or any other symptoms.   Past Medical History  Diagnosis Date  . Depression     Patient Active Problem List   Diagnosis Date Noted  . Tobacco use disorder 01/05/2016  . Alcohol use disorder, severe, dependence (HCC) 01/05/2016  . Alcohol withdrawal (HCC) 01/05/2016  . Cannabis use disorder, moderate, dependence (HCC) 01/05/2016  . Bipolar 2 disorder, major depressive episode (HCC) 01/04/2016  . 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 Chronic prostatitis 12/22/2012  . Hernia, inguinal 12/22/2012    History reviewed. No pertinent past surgical history.  Current Outpatient Rx  Name  Route  Sig  Dispense  Refill  . loratadine (CLARITIN) 10 MG tablet   Oral   Take 10 mg by mouth daily as needed for allergies.         . ARIPiprazole (ABILIFY) 15 MG tablet   Oral   Take 1 tablet (15 mg total) by mouth daily.   30 tablet   0   . traZODone (DESYREL) 100 MG tablet   Oral   Take 1 tablet (100 mg total) by mouth at bedtime.   30 tablet   0     Allergies Penicillins  History reviewed. No pertinent family history.  Social History Social History  Substance Use Topics  . Smoking status: Current Every Day Smoker -- 1.00 packs/day    Types: Cigarettes  . Smokeless tobacco: None  . Alcohol Use: Yes    Review of Systems Constitutional: No fever/chills Eyes: No visual changes. ENT: No sore  throat. Cardiovascular: Denies chest pain. Respiratory: Denies shortness of breath. Gastrointestinal: No abdominal pain.  No nausea, no vomiting.  No diarrhea.  No constipation. Genitourinary: Negative for dysuria. Musculoskeletal: Negative for back pain. Skin: Negative for rash. Neurological: Negative for headaches, focal weakness or numbness. Psychiatric:depression, SI with a plan  10-point ROS otherwise negative.  ____________________________________________   PHYSICAL EXAM:  VITAL SIGNS: ED Triage Vitals  Enc Vitals Group     BP 01/04/16 0948 134/82 mmHg     Pulse Rate 01/04/16 0948 120     Resp 01/04/16 0948 20     Temp 01/04/16 0948 98 F (36.7 C)     Temp Source 01/04/16 0948 Oral     SpO2 01/04/16 0948 100 %     Weight 01/04/16 0948 180 lb (81.647 kg)     Height 01/04/16 0948  (1.753 m)     Head Cir --      Peak Flow --      Pain Score 01/04/16 0949 0     Pain Loc --      Pain Edu? --      Excl. in GC? --     Constitutional: Alert and oriented. Well appearing and in no acute distress. Eyes: Conjunctivae are normal. PERRL. EOMI. Head: Atraumatic. Nose: No congestion/rhinnorhea. Mouth/Throat: Mucous membranes are moist.  Oropharynx non-erythematous. Neck: No stridor.   Cardiovascular: Normal rate, regular rhythm. Grossly normal heart sounds.  Good peripheral circulation. Respiratory:  Normal respiratory effort.  No retractions. Lungs CTAB. Gastrointestinal: Soft and nontender. No distention. No abdominal bruits. No CVA tenderness. Musculoskeletal: No lower extremity tenderness nor edema.  No joint effusions. Neurologic:  Normal speech and language. No gross focal neurologic deficits are appreciated. No gait instability. Skin:  Skin is warm, dry and intact. No rash noted. Psychiatric: Mood and affect are depressedl. Speech and behavior are normal.  ____________________________________________   LABS (all labs ordered are listed, but only abnormal  results are displayed)  Labs Reviewed  COMPREHENSIVE METABOLIC PANEL - Abnormal; Notable for the following:    CO2 18 (*)    Total Bilirubin 1.3 (*)    All other components within normal limits  ETHANOL - Abnormal; Notable for the following:    Alcohol, Ethyl (B) 139 (*)    All other components within normal limits  ACETAMINOPHEN LEVEL - Abnormal; Notable for the following:    Acetaminophen (Tylenol), Serum <10 (*)    All other components within normal limits  CBC - Abnormal; Notable for the following:    WBC 14.4 (*)    Hemoglobin 18.5 (*)    HCT 52.6 (*)    All other components within normal limits  URINE DRUG SCREEN, QUALITATIVE ([facility] ONLY) - Abnormal; Notable for the following:    Cannabinoid 50 Ng, Ur [place] POSITIVE (*)    All other components within normal limits  CHLAMYDIA/NGC RT PCR ([facility] ONLY)  SALICYLATE LEVEL  RAPID HIV SCREEN (HIV 1/2 AB+AG)   ____________________________________________  EKG  none ____________________________________________  RADIOLOGY  none ____________________________________________   PROCEDURES  Procedure(s) performed: None  Critical Care performed: No  ____________________________________________   INITIAL IMPRESSION / ASSESSMENT AND PLAN / ED COURSE  Pertinent labs & imaging results that were available during my care of the patient were reviewed by me and considered in my medical decision making (see chart for details).  1:26 PM Patient still awaiting evaluation by psychiatry and behavioral health/TTS for further treatment and possible admission. Patient will be signed out to Dr. York CeriseForbach at 4 PM. ____________________________________________   FINAL CLINICAL IMPRESSION(S) / ED DIAGNOSES  Final diagnoses:  Depression  Suicidal ideation      Leona CarryLinda M Fay Swider, MD 01/04/16 1607  Leona CarryLinda M Deanda Ruddell, MD 01/28/16 1323  Leona CarryLinda M Zaeden Lastinger, MD 01/28/16 1327

## 2016-01-04 NOTE — ED Notes (Signed)
Pt to ed with c/o depression and suicidal thoughts x several months,  Pt states plan to overdose on medications.

## 2016-01-04 NOTE — BH Assessment (Addendum)
Assessment Note  Brett [name] is an 35 y.o. male. Who voluntarily presented to the ED on today seeking a psychiatric evaluation. Pt states that he lives with his wife and three children. 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 Pt states that he discovered that his wife of two years has been cheating on him. Pt reports that he has been aware of this for approximately five months. Pt states that he was under the impression that the affair was over but found out on yesterday that his wife was still seeing someone else.   Pt. has expressed that he has been experiencing suicidal ideations with a plan to overdose, onset on yesterday (01/03/16). Pt reports that he has 12 pills that he planned on taking this morning but the thought of his children prevented him for following through with this plan. Pt unable to contract for safety with writer Pt reports a history of Anxiety and Bipolar disorder. Pt reports that he hasn't eaten since 12/04/15. Pt complains of decreased in ability to obtain restful sleep and weight  loss (30 within the past/x4 months). Pt. denies the presence of any auditory or visual hallucinations at this time.  Pt has also shared that he has had HI towards the person that his wife has been cheating with. Pt states that he knows this individuals name, address and work place. Pt reports that he carries a baseball bat in his car with intentions for harming this individual. Pt states 'He ruined my life and I want to ruin his." Patient denies any  medical complaints at this time. The patient does meet admission criteria at this time. This was explained to the pt, who voiced understanding.  Pt given information per his request on preparing advanced directives.  Diagnosis: Bipolar Disorder   Past Medical History:  Past Medical History  Diagnosis Date  . Depression     History reviewed. No pertinent past surgical history.  Family  History: History reviewed. No pertinent family history.  Social History:  reports that he has been smoking Cigarettes.  He has been smoking about 1.00 pack per day. He does not have any smokeless tobacco history on file. He reports that he drinks alcohol. He reports that he uses illicit drugs (Marijuana).  Additional Social History:  Alcohol / Drug Use Pain Medications: See PTA Prescriptions: See PTA Over the Counter: See PTA History of alcohol / drug use?: Yes Longest period of sobriety (when/how Magro): Unknown  Negative Consequences of Use: Personal relationships Withdrawal Symptoms:  (None Reported ) Substance #1 Name of Substance 1: Alcohol  1 - Age of First Use: 35 y.o.  1 - Amount (size/oz): 6-7 12oz beers  1 - Frequency: Daily  1 - Duration: 6 months  1 - Last Use / Amount: 01/03/16, Unknown  Substance #2 Name of Substance 2: Marijuana  2 - Age of First Use: 35 y.o 2 - Amount (size/oz): A few hit (smokes from a Bowl) 2 - Frequency: Daily  2 - Duration: 8 years  2 - Last Use / Amount: 01/03/16  CIWA: CIWA-Ar BP: 126/82 mmHg Pulse Rate: (!) 101 COWS:    Allergies:  Allergies  Allergen Reactions  . Penicillins Other (See Comments)    Reaction:  Unknown      Home Medications:  (Not in a hospital admission)  OB/GYN Status:  No LMP for male patient.  General Assessment Data [facility] Assessment:  In system Is this a Tele or Face-to-Face Assessment?: Face-to-Face Is this an Initial Assessment or a Re-assessment for this encounter?: Initial Assessment Marital status: Married Is patient pregnant?: No Pregnancy Status: No Living Arrangements: Spouse/significant other, Children Can pt return to current living arrangement?: Yes Admission Status: Voluntary Is patient capable of signing voluntary admission?: Yes Referral Source: Self/Family/Friend Insurance type: None   Medical Screening Exam [facility] Walk-in ONLY) Medical Exam completed:  Yes  Crisis Care Plan Living Arrangements: Spouse/significant other, Children Legal Guardian: Other: (None ) Name of Psychiatrist: None  Name of Therapist: None   Education Status Is patient currently in school?: No Current Grade: N/A Highest grade of school patient has completed: HS Name of school: N/A Contact person: N/A  Risk to self with the past 6 months Suicidal Ideation: Yes-Currently Present Has patient been a risk to self within the past 6 months prior to admission? : Yes Suicidal Intent: No-Not Currently/Within Last 6 Months Has patient had any suicidal intent within the past 6 months prior to admission? : Yes Is patient at risk for suicide?: Yes Suicidal Plan?: No-Not Currently/Within Last 6 Months Has patient had any suicidal plan within the past 6 months prior to admission? : Yes Access to Means: Yes Specify Access to Suicidal Means: Assess to medication  What has been your use of drugs/alcohol within the last 12 months?: Alcohol and marijuana  Previous Attempts/Gestures: No How many times?: 0 Other Self Harm Risks: Unknown  Triggers for Past Attempts: Family contact Intentional Self Injurious Behavior: None Family Suicide History: No Recent stressful life event(s): Conflict (Comment) (Marital ) Persecutory voices/beliefs?: No Depression: Yes Depression Symptoms: Loss of interest in usual pleasures, Feeling angry/irritable, Feeling worthless/self pity Substance abuse history and/or treatment for substance abuse?: Yes Suicide prevention information given to non-admitted patients: Yes  Risk to Others within the past 6 months Homicidal Ideation: Yes-Currently Present Does patient have any lifetime risk of violence toward others beyond the six months prior to admission? : No Thoughts of Harm to Others: Yes-Currently Present Comment - Thoughts of Harm to Others: Pt states that he has thoughts of hurting his wifes boyfriend  Physicist, medical ) Current Homicidal  Intent: Yes-Currently Present Current Homicidal Plan: Yes-Currently Present Describe Current Homicidal Plan: Pt states that he carries a bat with him everywhere  Access to Homicidal Means: Yes Describe Access to Homicidal Means: Pt states that he carries a bat with him everywhere  Identified Victim: Milderd Meager  History of harm to others?: No Assessment of Violence: None Noted Violent Behavior Description: None Reproted  Does patient have access to weapons?: No Criminal Charges Pending?: No Does patient have a court date: No Is patient on probation?: No  Psychosis Hallucinations: None noted Delusions: None noted  Mental Status Report Appearance/Hygiene: In scrubs Eye Contact: Fair Motor Activity: Freedom of movement Speech: Logical/coherent Level of Consciousness: Alert Mood: Depressed Affect: Flat, Blunted, Depressed Anxiety Level: None Thought Processes: Coherent, Relevant Judgement: Partial Orientation: Person, Place, Time, Situation Obsessive Compulsive Thoughts/Behaviors: None  Cognitive Functioning Concentration: Fair Memory: Remote Intact, Recent Intact IQ: Average Insight: Fair Impulse Control: Fair Appetite: Poor Weight Loss: 31 (pounds) Weight Gain: 0 Sleep: Decreased Total Hours of Sleep: 5 Vegetative Symptoms: None  ADLScreening [facility]) Patient's cognitive ability adequate to safely complete daily activities?: Yes Patient able to express need for assistance with ADLs?: Yes Independently performs ADLs?: Yes (appropriate for developmental age)  Prior Inpatient Therapy Prior Inpatient Therapy: Yes Prior Therapy Dates: 2009,2010 Prior Therapy Facilty/Provider(s):  [facility] [place] Reason for Treatment: Bipolar   Prior Outpatient Therapy Prior Outpatient Therapy: No Prior Therapy Dates: N/A Prior Therapy Facilty/Provider(s): N/A Reason for Treatment: N/A Does patient have an ACCT team?: No Does patient have  Intensive In-House Services?  : No Does patient have [facility] services? : No Does patient have P4CC services?: No  ADL Screening (condition at time of admission) Patient's cognitive ability adequate to safely complete daily activities?: Yes Patient able to express need for assistance with ADLs?: Yes Independently performs ADLs?: Yes (appropriate for developmental age)       Abuse/Neglect Assessment (Assessment to be complete while patient is alone) Physical Abuse: Denies Verbal Abuse: Denies Sexual Abuse: Denies Exploitation of patient/patient's resources: Denies Self-Neglect: Denies Values / Beliefs Cultural Requests During Hospitalization: None Spiritual Requests During Hospitalization: None Consults Spiritual Care Consult Needed: No Social Work Consult Needed: No      Additional Information 1:1 In Past 12 Months?: No CIRT Risk: No Elopement Risk: No Does patient have medical clearance?: Yes     Disposition:  Disposition Initial Assessment Completed for this Encounter: Yes Disposition of Patient: Inpatient treatment program Type of inpatient treatment program: Adult  On Site Evaluation by:   Reviewed with Physician:    Asa Saunas 01/04/2016 1:50 PM

## 2016-01-04 NOTE — ED Notes (Signed)
At pts request his ring of keys was given to his mom.

## 2016-01-04 NOTE — ED Notes (Signed)
Report was received from Dario Guardianravis F., RN; Pt. Verbalizes no complaints or distress; verbalizes having S.I.Continue to monitor with 15 min. Monitoring.

## 2016-01-04 NOTE — ED Provider Notes (Signed)
-----------------------------------------   7:07 PM on 01/04/2016 -----------------------------------------  Dr. Toni Amendlapacs wrote a note saying that the patient is appropriate for admission.  We are currently awaiting a behavioral medicine bed.  We are also waiting GC/Chlamydia results; he requested to be tested based on his wife's alleged infidelity.  [facility]  ----------------------------------------- 9:00 PM on 01/04/2016 -----------------------------------------  Gonorrhea, chlamydia, and HIV screenings are all negative.  Loleta Roseory Elfreda Blanchet, MD 01/04/16 2101

## 2016-01-04 NOTE — Consult Note (Addendum)
[facility] Consult   Reason for Consult:  Consult for 35 year old man with history of depression and alcohol abuse Referring Physician:  Lovena Le Patient Identification: Brett [name] MRN:  [id number] Principal Diagnosis: Bipolar 2 disorder, major depressive episode [facility]) Diagnosis:   Patient Active Problem List   Diagnosis Date Noted  . Severe recurrent major depression without psychotic features ([place]) [F33.2] 01/04/2016  . Alcohol abuse [F10.10] 01/04/2016  . Marijuana abuse [F12.10] 01/04/2016  . Suicidal ideation [R45.851] 01/04/2016  . Bipolar 2 disorder, major depressive episode ([place]) [F31.81] 01/04/2016    Total Time spent with patient: 1 hour  Subjective:   Brett [name] is a 35 y.o. male patient admitted with "if it were not for my kids I would've taken the pills".  HPI:  Patient interviewed. Chart reviewed including old chart notes. Labs and vitals reviewed. 35 year old man brought in voluntarily to the hospital stating that he was planning to kill himself today. He said that he poured out a bottle of "nerve pills" and was planning to take all of them but only didn't when he thought of his young children being around. He says his mood is been depressed for many months and is just getting worse. Feels anxious nervous depressed down and hopeless all the time. Sleep is poor. Appetite is poor and he says he has lost a great deal of weight. He feels agitated and nervous. He has taken to drinking regularly at night quite a bit as well as smoking marijuana regularly at night to try to get to sleep. Denies hallucinations. Major stress is that he and his wife have separated because according to him she is having an affair. He says that he just learned yesterday that she is still sleeping with the man she had an affair with and this drove him over the edge. Not reporting hallucinations. Does report that he's had some thoughts about looking for the man who is sleeping  with his wife and beating him to death with a baseball bat. Affect social history  Social history: Patient works as a Pharmacist, community. Married. 3 young children. Separated from his wife and living in an apartment now.  Substance abuse history: History of alcohol abuse. No history of alcohol withdrawal seizures or delirium tremens. He has had periods of sustained sobriety in the past and participated in the intensive outpatient program when he used to exist here. Recently not going to any substance abuse treatment. Also abusing marijuana regularly.  Medical history: No really clear significant ongoing medical problems.  Past Psychiatric History: Patient has had prior psychiatric hospital stays. He has been diagnosed with bipolar disorder in the past he says although it's not clear that he's had manic episodes that were separate from drinking. He has never according to him tried to kill himself in the past. He describes having been on several different medicines in the past including Depakote and Seroquel but recently had not been getting any outpatient treatment. Denies any past history of violent behavior.  Risk to Self: Suicidal Ideation: Yes-Currently Present Suicidal Intent: No-Not Currently/Within Last 6 Months Is patient at risk for suicide?: Yes Suicidal Plan?: No-Not Currently/Within Last 6 Months Access to Means: Yes Specify Access to Suicidal Means: Assess to medication  What has been your use of drugs/alcohol within the last 12 months?: Alcohol and marijuana  How many times?: 0 Other Self Harm Risks: Unknown  Triggers for Past Attempts: Family contact Intentional Self Injurious Behavior: None Risk to Others:  Homicidal Ideation: Yes-Currently Present Thoughts of Harm to Others: Yes-Currently Present Comment - Thoughts of Harm to Others: Pt states that he has thoughts of hurting his wifes boyfriend  Contractor ) Current Homicidal Intent: Yes-Currently  Present Current Homicidal Plan: Yes-Currently Present Describe Current Homicidal Plan: Pt states that he carries a bat with him everywhere  Access to Homicidal Means: Yes Describe Access to Homicidal Means: Pt states that he carries a bat with him everywhere  Identified Victim: Ashok Croon  History of harm to others?: No Assessment of Violence: None Noted Violent Behavior Description: None Reproted  Does patient have access to weapons?: No Criminal Charges Pending?: No Does patient have a court date: No Prior Inpatient Therapy:   Prior Outpatient Therapy:    Past Medical History:  Past Medical History  Diagnosis Date  . Depression    History reviewed. No pertinent past surgical history. Family History: History reviewed. No pertinent family history. Family Psychiatric  History: Positive for alcohol abuse as well as depression in 2 of his close relatives. Social History:  History  Alcohol Use  . Yes     History  Drug Use  . Yes  . Special: Marijuana    Social History   Social History  . Marital Status: Married    Spouse Name: N/A  . Number of Children: N/A  . Years of Education: N/A   Social History Main Topics  . Smoking status: Current Every Day Smoker -- 1.00 packs/day    Types: Cigarettes  . Smokeless tobacco: None  . Alcohol Use: Yes  . Drug Use: Yes    Special: Marijuana  . Sexual Activity: Not Asked   Other Topics Concern  . None   Social History Narrative   Additional Social History:    Allergies:   Allergies  Allergen Reactions  . Penicillins Other (See Comments)    Reaction:  Unknown      Labs:  Results for orders placed or performed during the hospital encounter of 01/04/16 (from the past 48 hour(s))  Comprehensive metabolic panel     Status: Abnormal   Collection Time: 01/04/16  9:53 AM  Result Value Ref Range   Sodium 136 135 - 145 mmol/L   Potassium 3.8 3.5 - 5.1 mmol/L   Chloride 104 101 - 111 mmol/L   CO2 18 (L) 22 - 32 mmol/L    Glucose, Bld 66 65 - 99 mg/dL   BUN 12 6 - 20 mg/dL   Creatinine, Ser 1.15 0.61 - 1.24 mg/dL   Calcium 9.0 8.9 - 10.3 mg/dL   Total Protein 8.1 6.5 - 8.1 g/dL   Albumin 4.8 3.5 - 5.0 g/dL   AST 28 15 - 41 U/L   ALT 18 17 - 63 U/L   Alkaline Phosphatase 72 38 - 126 U/L   Total Bilirubin 1.3 (H) 0.3 - 1.2 mg/dL   GFR calc non Af Amer >60 >60 mL/min   GFR calc Af Amer >60 >60 mL/min    Comment: (NOTE) The eGFR has been calculated using the CKD EPI equation. This calculation has not been validated in all clinical situations. eGFR's persistently <60 mL/min signify possible Chronic Kidney Disease.    Anion gap 14 5 - 15  Ethanol (ETOH)     Status: Abnormal   Collection Time: 01/04/16  9:53 AM  Result Value Ref Range   Alcohol, Ethyl (B) 139 (H) <5 mg/dL    Comment:        LOWEST DETECTABLE LIMIT FOR  SERUM ALCOHOL IS 5 mg/dL FOR MEDICAL PURPOSES ONLY   Salicylate level     Status: None   Collection Time: 01/04/16  9:53 AM  Result Value Ref Range   Salicylate Lvl <8.1 2.8 - 30.0 mg/dL  Acetaminophen level     Status: Abnormal   Collection Time: 01/04/16  9:53 AM  Result Value Ref Range   Acetaminophen (Tylenol), Serum <10 (L) 10 - 30 ug/mL    Comment:        THERAPEUTIC CONCENTRATIONS VARY SIGNIFICANTLY. A RANGE OF 10-30 ug/mL MAY BE AN EFFECTIVE CONCENTRATION FOR MANY PATIENTS. HOWEVER, SOME ARE BEST TREATED AT CONCENTRATIONS OUTSIDE THIS RANGE. ACETAMINOPHEN CONCENTRATIONS >150 ug/mL AT 4 HOURS AFTER INGESTION AND >50 ug/mL AT 12 HOURS AFTER INGESTION ARE OFTEN ASSOCIATED WITH TOXIC REACTIONS.   CBC     Status: Abnormal   Collection Time: 01/04/16  9:53 AM  Result Value Ref Range   WBC 14.4 (H) 3.8 - 10.6 K/uL   RBC 5.51 4.40 - 5.90 MIL/uL   Hemoglobin 18.5 (H) 13.0 - 18.0 g/dL   HCT 52.6 (H) 40.0 - 52.0 %   MCV 95.5 80.0 - 100.0 fL   MCH 33.5 26.0 - 34.0 pg   MCHC 35.1 32.0 - 36.0 g/dL   RDW 12.8 11.5 - 14.5 %   Platelets 180 150 - 440 K/uL    Current  Facility-Administered Medications  Medication Dose Route Frequency Provider Last Rate Last Dose  . chlordiazePOXIDE (LIBRIUM) capsule 10 mg  10 mg Oral Once Gonzella Lex, MD      . divalproex (DEPAKOTE) DR tablet 500 mg  500 mg Oral QHS Gonzella Lex, MD       Current Outpatient Prescriptions  Medication Sig Dispense Refill  . doxycycline (VIBRAMYCIN) 100 MG capsule Take 1 capsule (100 mg total) by mouth 2 (two) times daily. (Patient not taking: Reported on 05/05/2015) 20 capsule 0  . fluticasone (FLONASE) 50 MCG/ACT nasal spray Place 2 sprays into both nostrils daily. (Patient not taking: Reported on 05/05/2015) 16 g 0  . loratadine (CLARITIN) 10 MG tablet Take 10 mg by mouth daily as needed for allergies.    . mupirocin nasal ointment (BACTROBAN) 2 % Apply in each nostril daily x 5 days (Patient not taking: Reported on 05/05/2015) 10 g 0  . Pseudoephedrine-Guaifenesin (MUCINEX D) [phone] MG TB12 Take 1 tablet by mouth 2 (two) times daily. (Patient not taking: Reported on 05/05/2015) 20 each 0    Musculoskeletal: Strength & Muscle Tone: within normal limits Gait & Station: normal Patient leans: N/A  Psychiatric Specialty Exam: Review of Systems  Constitutional: Negative.   HENT: Negative.   Eyes: Negative.   Respiratory: Negative.   Cardiovascular: Negative.   Gastrointestinal: Negative.   Musculoskeletal: Negative.   Skin: Negative.   Neurological: Negative.   Psychiatric/Behavioral: Positive for depression, suicidal ideas and substance abuse. Negative for hallucinations and memory loss. The patient is nervous/anxious and has insomnia.     Blood pressure 126/82, pulse 101, temperature 98 F (36.7 C), temperature source Oral, resp. rate 18, height 5' 9"  (1.753 m), weight 81.647 kg (180 lb), SpO2 98 %.Body mass index is 26.57 kg/(m^2).  General Appearance: Fairly Groomed  Engineer, water::  Fair  Speech:  Clear and Coherent  Volume:  Decreased  Mood:  Anxious and Depressed  Affect:   Depressed  Thought Process:  Goal Directed  Orientation:  Full (Time, Place, and Person)  Thought Content:  Rumination  Suicidal Thoughts:  Yes.  with intent/plan  Homicidal Thoughts:  Yes.  with intent/plan  Memory:  Immediate;   Good Recent;   Good Remote;   Fair  Judgement:  Fair  Insight:  Fair  Psychomotor Activity:  Decreased  Concentration:  Fair  Recall:  [employer] of Knowledge:Fair  Language: Fair  Akathisia:  No  Handed:  Right  AIMS (if indicated):     Assets:  Communication Skills Desire for Improvement Financial Resources/Insurance Physical Health Resilience  ADL's:  Intact  Cognition: WNL  Sleep:      Treatment Plan Summary: Daily contact with patient to assess and evaluate symptoms and progress in treatment, Medication management and Plan Patient with a history of mood disorder currently very depressed sad down and anxious. Also drinking and abusing drugs. Active suicidal ideation with thoughts to overdose today. Not getting any appropriate outpatient treatment right now. Patient meets criteria for admission to the psychiatric hospital. Currently unclear whether a bed will be available today. We will monitor him for any signs of withdrawal. Try to restart psychiatric medicine. Supportive counseling and review of treatment plan with the patient.  Disposition: Recommend psychiatric Inpatient admission when medically cleared. Supportive therapy provided about ongoing stressors.  Alethia Berthold, MD 01/04/2016 1:05 PM

## 2016-01-04 NOTE — ED Notes (Signed)
ED BHU PLACEMENT JUSTIFICATION Is the patient under IVC or is there intent for IVC: No. Is the patient medically cleared: No. Is there vacancy in the ED BHU: Yes.   Is the population mix appropriate for patient: Yes.   Is the patient awaiting placement in inpatient or outpatient setting: Yes.   Has the patient had a psychiatric consult: Yes.   Survey of unit performed for contraband, proper placement and condition of furniture, tampering with fixtures in bathroom, shower, and each patient room: Yes.   APPEARANCE/BEHAVIOR calm, cooperative and adequate rapport can be established NEURO ASSESSMENT Orientation: time, place and person Hallucinations: No.None noted (Hallucinations) Speech: Normal Gait: normal RESPIRATORY ASSESSMENT Normal expansion.  Clear to auscultation.  No rales, rhonchi, or wheezing. CARDIOVASCULAR ASSESSMENT regular rate and rhythm, S1, S2 normal, no murmur, click, rub or gallop GASTROINTESTINAL ASSESSMENT soft, nontender, BS WNL, no r/g EXTREMITIES normal strength, tone, and muscle mass PLAN OF CARE Provide calm/safe environment. Vital signs assessed twice daily. ED BHU Assessment once each 12-hour shift. Collaborate with intake RN daily or as condition indicates. Assure the ED provider has rounded once each shift. Provide and encourage hygiene. Provide redirection as needed. Assess for escalating behavior; address immediately and inform ED provider.  Assess family dynamic and appropriateness for visitation as needed: Yes.  ; If necessary, describe findings:  Educate the patient/family about BHU procedures/visitation: Yes.

## 2016-01-04 NOTE — ED Notes (Signed)
Patient asked to contact his wife to check on the kids and to check on how she was doing. Telephone call was placed to Patient's; per patient( Christina Yott--[phone]); Per wife, "the kids are doing great; they are in bed; and I'm getting ready to go to bed; he(Quantavius); "he text me today and said that, he was having a hard time dealing with things; his mother called me and told me that; he told her the same thing; and that, he was thinking about hurting himself and he needs to go to the hospital."  Per patient, "I found out (2) days that, my wife was cheating on me with a guy from her old job; and I'm trying to deal with it."

## 2016-01-05 ENCOUNTER — Inpatient Hospital Stay
Admission: RE | Admit: 2016-01-05 | Discharge: 2016-01-08 | DRG: 885 | Disposition: A | Discharge status: Home / Self Care | Payer: No Typology Code available for payment source | Source: Intra-hospital | Attending: Psychiatry | Admitting: Psychiatry

## 2016-01-05 DIAGNOSIS — F17210 Nicotine dependence, cigarettes, uncomplicated: Secondary | ICD-10-CM | POA: Diagnosis present

## 2016-01-05 DIAGNOSIS — F3181 Bipolar II disorder: Secondary | ICD-10-CM | POA: Diagnosis not present

## 2016-01-05 DIAGNOSIS — F17200 Nicotine dependence, unspecified, uncomplicated: Secondary | ICD-10-CM

## 2016-01-05 DIAGNOSIS — Z880 Allergy status to penicillin: Secondary | ICD-10-CM

## 2016-01-05 DIAGNOSIS — F419 Anxiety disorder, unspecified: Secondary | ICD-10-CM | POA: Diagnosis present

## 2016-01-05 DIAGNOSIS — F1020 Alcohol dependence, uncomplicated: Secondary | ICD-10-CM

## 2016-01-05 DIAGNOSIS — F1220 Cannabis dependence, uncomplicated: Secondary | ICD-10-CM

## 2016-01-05 DIAGNOSIS — Z8249 Family history of ischemic heart disease and other diseases of the circulatory system: Secondary | ICD-10-CM

## 2016-01-05 DIAGNOSIS — R45851 Suicidal ideations: Secondary | ICD-10-CM | POA: Diagnosis present

## 2016-01-05 DIAGNOSIS — F10239 Alcohol dependence with withdrawal, unspecified: Secondary | ICD-10-CM | POA: Diagnosis present

## 2016-01-05 DIAGNOSIS — Y906 Blood alcohol level of 120-199 mg/100 ml: Secondary | ICD-10-CM | POA: Diagnosis present

## 2016-01-05 DIAGNOSIS — Z818 Family history of other mental and behavioral disorders: Secondary | ICD-10-CM

## 2016-01-05 DIAGNOSIS — G4700 Insomnia, unspecified: Secondary | ICD-10-CM | POA: Diagnosis present

## 2016-01-05 DIAGNOSIS — F10939 Alcohol use, unspecified with withdrawal, unspecified: Secondary | ICD-10-CM

## 2016-01-05 HISTORY — DX: Alcohol dependence, uncomplicated: F10.20

## 2016-01-05 LAB — URINE DRUG SCREEN, QUALITATIVE (ARMC ONLY)
AMPHETAMINES, UR SCREEN: NOT DETECTED
BARBITURATES, UR SCREEN: NOT DETECTED
BENZODIAZEPINE, UR SCRN: POSITIVE — AB
Cannabinoid 50 Ng, Ur ~~LOC~~: POSITIVE — AB
Cocaine Metabolite,Ur ~~LOC~~: NOT DETECTED
MDMA (Ecstasy)Ur Screen: NOT DETECTED
Methadone Scn, Ur: NOT DETECTED
OPIATE, UR SCREEN: NOT DETECTED
PHENCYCLIDINE (PCP) UR S: NOT DETECTED
Tricyclic, Ur Screen: NOT DETECTED

## 2016-01-05 MED ORDER — NICOTINE 21 MG/24HR TD PT24
21.0000 mg | MEDICATED_PATCH | Freq: Every day | TRANSDERMAL | Status: DC
  Administered 2016-01-06 – 2016-01-08 (×3): 21 mg via TRANSDERMAL
  Filled 2016-01-05 (×3): qty 1

## 2016-01-05 MED ORDER — ALUM & MAG HYDROXIDE-SIMETH 200-200-20 MG/5ML PO SUSP
30.0000 mL | ORAL | Status: DC | PRN
Start: 2016-01-05 — End: 2016-01-08

## 2016-01-05 MED ORDER — DIVALPROEX SODIUM 500 MG PO DR TAB: 500.0000 mg | DELAYED_RELEASE_TABLET | Freq: Every day | ORAL | Status: DC

## 2016-01-05 MED ORDER — CHLORDIAZEPOXIDE HCL 10 MG PO CAPS
10.0000 mg | ORAL_CAPSULE | Freq: Three times a day (TID) | ORAL | Status: DC
  Administered 2016-01-05 – 2016-01-08 (×9): 10 mg via ORAL
  Filled 2016-01-05 (×9): qty 1

## 2016-01-05 MED ORDER — ARIPIPRAZOLE 15 MG PO TABS
15.0000 mg | ORAL_TABLET | Freq: Every day | ORAL | Status: DC
  Administered 2016-01-05 – 2016-01-07 (×3): 15 mg via ORAL
  Filled 2016-01-05 (×3): qty 1

## 2016-01-05 MED ORDER — MAGNESIUM HYDROXIDE 400 MG/5ML PO SUSP
30.0000 mL | Freq: Every day | ORAL | Status: DC | PRN
Start: 2016-01-05 — End: 2016-01-08

## 2016-01-05 MED ORDER — CHLORDIAZEPOXIDE HCL 10 MG PO CAPS
10.0000 mg | ORAL_CAPSULE | Freq: Once | ORAL | Status: DC
  Administered 2016-01-05: 10 mg via ORAL

## 2016-01-05 MED ORDER — ACETAMINOPHEN 325 MG PO TABS
650.0000 mg | ORAL_TABLET | Freq: Four times a day (QID) | ORAL | Status: DC | PRN
  Administered 2016-01-06: 650 mg via ORAL
  Filled 2016-01-05: qty 2

## 2016-01-05 MED ORDER — TRAZODONE HCL 100 MG PO TABS
100.0000 mg | ORAL_TABLET | Freq: Every day | ORAL | Status: DC
  Administered 2016-01-05 – 2016-01-07 (×3): 100 mg via ORAL
  Filled 2016-01-05 (×4): qty 1

## 2016-01-05 NOTE — [facility] Group Notes (Signed)
[facility] LCSW Aftercare Discharge Planning Group Note  01/05/2016 9:30 AM  Participation Quality: Did Not Attend. Patient invited to participate but declined.   Zeeva Courser F. Alexy Bringle, MSW, LCSWA, LCAS   

## 2016-01-05 NOTE — ED Notes (Signed)
Patient took a shower. Maintained on 15 minute checks and observation by security camera for safety. 

## 2016-01-05 NOTE — ED Notes (Signed)
Patient is in room sleeping; is calm; toilleting offered yes(/no) SLEEPING; sitter needed yes(/no); Law Enforcement present; ODS/Security ( Yes)/no; continue to monitor Patient status:

## 2016-01-05 NOTE — ED Provider Notes (Signed)
-----------------------------------------   7:54 AM on 01/05/2016 -----------------------------------------   Blood pressure 126/82, pulse 101, temperature 98 F (36.7 C), temperature source Oral, resp. rate 18, height 5\' 9"  (1.753 m), weight 180 lb (81.647 kg), SpO2 98 %.  The patient had no acute events since last update.  Calm and cooperative at this time.  Disposition is pending per Psychiatry/Behavioral Medicine team recommendations.      Jeanmarie PlantJames A Recardo Linn, MD 01/05/16 [phone]

## 2016-01-05 NOTE — ED Notes (Signed)
Patient awake, alert, and oriented. Patient states he still feels depressed but no longer feels suicidal. He wants to get back on medications to help with mood.  Patient is aware that he will be hospitalized and he is agreeable with this plan.Maintained on 15 minute checks and observation by security camera for safety.

## 2016-01-05 NOTE — [facility] Group Notes (Signed)
[facility] LCSW Group Therapy   01/05/2016 1pm  Type of Therapy: Group Therapy   Participation Level: Did Not Attend. Patient invited to participate but declined.    Bhavik Cabiness F. Shavonte Zhao, MSW, LCSWA, LCAS   

## 2016-01-05 NOTE — ED Notes (Signed)
ED BHU PLACEMENT JUSTIFICATION Is the patient under IVC or is there intent for IVC: No. Is the patient medically cleared: No. Is there vacancy in the ED BHU: Yes.   Is the population mix appropriate for patient: Yes.   Is the patient awaiting placement in inpatient or outpatient setting: Yes.   Has the patient had a psychiatric consult: Yes.   Survey of unit performed for contraband, proper placement and condition of furniture, tampering with fixtures in bathroom, shower, and each patient room: Yes.   APPEARANCE/BEHAVIOR calm, cooperative and adequate rapport can be established NEURO ASSESSMENT Orientation: time, place and person Hallucinations: No.None noted (Hallucinations) Speech: Normal Gait: normal RESPIRATORY ASSESSMENT Normal expansion.  Clear to auscultation.  No rales, rhonchi, or wheezing. CARDIOVASCULAR ASSESSMENT regular rate and rhythm, S1, S2 normal, no murmur, click, rub or gallop GASTROINTESTINAL ASSESSMENT soft, nontender, BS WNL, no r/g EXTREMITIES normal strength, tone, and muscle mass PLAN OF CARE Provide calm/safe environment. Vital signs assessed twice daily. ED BHU Assessment once each 12-hour shift. Collaborate with intake RN daily or as condition indicates. Assure the ED provider has rounded once each shift. Provide and encourage hygiene. Provide redirection as needed. Assess for escalating behavior; address immediately and inform ED provider.  Assess family dynamic and appropriateness for visitation as needed: Yes.    Educate the patient/family about BHU procedures/visitation: Yes.

## 2016-01-05 NOTE — ED Notes (Signed)
Patient resting quietly in room. No noted distress or abnormal behaviors noted. Will continue 15 minute checks and observation by security camera for safety. 

## 2016-01-05 NOTE — [facility] Group Notes (Signed)
[facility] Group Notes:  (Nursing/MHT/Case Management/Adjunct)  Date:  01/05/2016  Time:  11:55 AM  Type of Therapy:  Movement Therapy  Participation Level:  Did Not Attend  Brett [name] Brett [name] Brett [name] Brett [name] 01/05/2016, 11:55 AM

## 2016-01-05 NOTE — Progress Notes (Signed)
Patient arrived on the unit at 1100. Noted to be anxious and verbalized that he is depressed. "I just want medications for depression. I have no insurance and I hope they help me get medications cheaply." Patient stated that marital problems were the trigger for his depression. He also stated that prior to being admitted he had thoughts of self harm by way of overdosing on medication. 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 Also wants to quit smoking and drinking. Says he has sort help for both issues before w/o success. Patient cooperative and pleasant. Belongings searched and contraband found was locked in his locker. (See property sheet).

## 2016-01-05 NOTE — ED Notes (Signed)
Called unit x 2 to give report. Nurse not available.

## 2016-01-05 NOTE — Tx Team (Signed)
Initial Interdisciplinary Treatment Plan   PATIENT STRESSORS: Marital or family conflict Substance abuse   PATIENT STRENGTHS: Ability for insight Motivation for treatment/growth   PROBLEM LIST: Problem List/Patient Goals Date to be addressed Date deferred Reason deferred Estimated date of resolution  Anxiety      Depression                                                 DISCHARGE CRITERIA:  Adequate post-discharge living arrangements Improved stabilization in mood, thinking, and/or behavior Reduction of life-threatening or endangering symptoms to within safe limits  PRELIMINARY DISCHARGE PLAN: Outpatient therapy  PATIENT/FAMIILY INVOLVEMENT: This treatment plan has been presented to and reviewed with the patient, Brett [name].  The patient and family have been given the opportunity to ask questions and make suggestions.  Myrla Malanowski W Kadia Abaya 01/05/2016, 11:56 AM

## 2016-01-05 NOTE — ED Notes (Signed)

## 2016-01-05 NOTE — ED Notes (Signed)
Report given to floor nurse. Patient to be transferred to [facility] unit for inpatient treatment. 

## 2016-01-05 NOTE — [facility] Suicide Risk Assessment (Addendum)
[facility]   Nursing information obtained from:    Demographic factors:    Current Mental Status:    Loss Factors:    Historical Factors:    Risk Reduction Factors:     Total Time spent with patient: 1 hour Principal Problem: Bipolar 2 disorder, major depressive episode (HCC) Diagnosis:   Patient Active Problem List   Diagnosis Date Noted  . Tobacco use disorder [F17.200] 01/05/2016  . Alcohol use disorder, severe, dependence (HCC) [F10.20] 01/05/2016  . Alcohol withdrawal (HCC) [F10.239] 01/05/2016  . Cannabis use disorder, moderate, dependence (HCC) [F12.20] 01/05/2016  . Bipolar 2 disorder, major depressive episode (HCC) [F31.81] 01/04/2016  . 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 Hernia, inguinal [K40.90] 12/22/2012   Subjective Data:   Continued Clinical Symptoms:  Alcohol Use Disorder Identification Test Final Score (AUDIT): 15 The "Alcohol Use Disorders Identification Test", Guidelines for Use in Primary Care, Second Edition.  World Science writerHealth Organization [facility](WHO). Score between 0-7:  no or low risk or alcohol related problems. Score between 8-15:  moderate risk of alcohol related problems. Score between 16-19:  high risk of alcohol related problems. Score 20 or above:  warrants further diagnostic evaluation for alcohol dependence and treatment.   CLINICAL FACTORS:   Bipolar Disorder:   Mixed State Alcohol/Substance Abuse/Dependencies Previous Psychiatric Diagnoses and Treatments   Psychiatric Specialty Exam: ROS  Blood pressure 141/93, pulse 82, temperature 97.9 F (36.6 C), temperature source Oral, resp. rate 20, height 5\' 9"  (1.753 m), SpO2 99 %.There is no weight on file to calculate BMI.   COGNITIVE FEATURES THAT CONTRIBUTE TO RISK:  None    SUICIDE RISK:   Moderate:  Frequent suicidal ideation with limited intensity, and duration, some specificity in terms of  plans, no associated intent, good self-control, limited dysphoria/symptomatology, some risk factors present, and identifiable protective factors, including available and accessible social support.  [facility]ARE[facility]  I certify that inpatient services furnished can reasonably be expected to improve the patient's condition.   Jimmy FootmanHernandez-Gonzalez,  Kieren Adkison, MD 01/05/2016, 4:04 PM

## 2016-01-05 NOTE — H&P (Addendum)
Psychiatric Admission Assessment Adult  Patient Identification: Brett [name] MRN:  [id number] Date of Evaluation:  01/05/2016 Chief Complaint:  SI Principal Diagnosis: Bipolar 2 disorder, major depressive episode ([place]) Diagnosis:   Patient Active Problem List   Diagnosis Date Noted  . Tobacco use disorder [F17.200] 01/05/2016  . Alcohol use disorder, severe, dependence ([place]) [F10.20] 01/05/2016  . Alcohol withdrawal ([place]) [F10.239] 01/05/2016  . Cannabis use disorder, moderate, dependence ([place]) [F12.20] 01/05/2016  . Bipolar 2 disorder, major depressive episode ([place]) [F31.81] 01/04/2016  . 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 Hernia, inguinal [K40.90] 12/22/2012   History of Present Illness:  Brett [name] is an 35 y.o. Male who voluntarily presented to the ED on 3/23 seeking a psychiatric evaluation due to anxiety and SI.  Alcohol level was 130.   Pt states that he lives with his wife and three children. 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 Pt states that he discovered that his wife of 10 years has been cheating on him. Pt reports that he has been aware of this for approximately five months. Pt states that he was under the impression that the affair was over but found out on yesterday that his wife was still seeing someone else. Patient says this is not the first time she has had an affair.  Pt. has expressed that he has been experiencing suicidal ideations with a plan to overdose for the last 2 days. Pt reports that he has 12 pills that he planned on taking them  but the thought of his children prevented him for following through with this plan.   Pt has a history of  Bipolar disorder and alcohol dep. Pt reports that he hasn't eaten since 12/04/15. Pt complains of decreased in ability to obtain restful sleep and weight loss (30 within the  past/x4 months). Pt. denies the presence of any auditory or visual hallucinations at this time. Pt has also shared that he has had HI towards the person that his wife has been cheating with. Pt states that he knows this individuals name, address and work place. Pt reports that he carries a baseball bat in his car with intentions for harming this individual. Pt states 'He ruined my life and I want to ruin his."   Substance abuse: h/o alcoholism. Has been admitted for detox in the past.  Received intensive outpt substance abuse years ago at this hospital.  Currently drinking 6 beers every night.  Pt smokes marijuana every night, and has done so for >15 years.  He somokes 1 pack cigarettes per day. Alcohol level at arrival at 9: 30 am was 130. Urine tox was + for cannabis.  Associated Signs/Symptoms: Depression Symptoms:  depressed mood, insomnia, suicidal thoughts with specific plan, (Hypo) Manic Symptoms:  Financial Extravagance, Impulsivity, Labiality of Mood, Anxiety Symptoms:  Excessive Worry, Psychotic Symptoms:  denies PTSD Symptoms: NA Total Time spent with patient: 1 hour  Past Psychiatric History: has been admitted in our unit at least 3 times before. He has been diagnosed with bipolar d/o type II and alcohol dep. No history of self injury or suicidal attempts. Pt has ben treated in the past with seroquel--sedation, zyprexa and depakote but never took medications for [place]. Has no f/u with psychiatry in years.  Prior to admission he was prescribed with buspar for anxiety.   Is the patient at risk to self?  Yes.    Has the patient been a risk to self in the past 6 months? No.  Has the patient been a risk to self within the distant past? Yes.    Is the patient a risk to others? No.  Has the patient been a risk to others in the past 6 months? No.  Has the patient been a risk to others within the distant past? No.    Past Medical History: denies having any chronic medical condition.  F/u with [facility] clinic for primary care.  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 Past Medical History  Diagnosis Date  . Depression    History reviewed. No pertinent past surgical history.  Family History: mother suffers from depression.  Multiple relatives with anxiety.  Per records--The patient reported that his mother has history of bipolar disorder and she takes Zyprexa. All his uncles are alcoholic. He denied any history of suicide in his family.   Social History: Married twice.  Has a 63 y/o child from first marriage.  He divorced his first wife due to her having an extramarital affair.  Married with second wife for 10 years. They have 2 kids ages 10 and 33 y/o.  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 Has been in that position for several years. Says he works about 60 h per week.  Wife works as well.  History  Alcohol Use  . Yes     History  Drug Use  . Yes  . Special: Marijuana     Allergies:   Allergies  Allergen Reactions  . Penicillins Rash    Reaction:  Unknown      Lab Results:  Results for orders placed or performed during the hospital encounter of 01/04/16 (from the past 48 hour(s))  Comprehensive metabolic panel     Status: Abnormal   Collection Time: 01/04/16  9:53 AM  Result Value Ref Range   Sodium 136 135 - 145 mmol/L   Potassium 3.8 3.5 - 5.1 mmol/L   Chloride 104 101 - 111 mmol/L   CO2 18 (L) 22 - 32 mmol/L   Glucose, Bld 66 65 - 99 mg/dL   BUN 12 6 - 20 mg/dL   Creatinine, Ser 1.15 0.61 - 1.24 mg/dL   Calcium 9.0 8.9 - 10.3 mg/dL   Total Protein 8.1 6.5 - 8.1 g/dL   Albumin 4.8 3.5 - 5.0 g/dL   AST 28 15 - 41 U/L   ALT 18 17 - 63 U/L   Alkaline Phosphatase 72 38 - 126 U/L   Total Bilirubin 1.3 (H) 0.3 - 1.2 mg/dL   GFR calc non Af Amer >60 >60 mL/min   GFR calc Af Amer >60 >60 mL/min    Comment: (NOTE) The eGFR has been calculated using the CKD EPI equation. This calculation has not been validated in all clinical  situations. eGFR's persistently <60 mL/min signify possible Chronic Kidney Disease.    Anion gap 14 5 - 15  Ethanol (ETOH)     Status: Abnormal   Collection Time: 01/04/16  9:53 AM  Result Value Ref Range   Alcohol, Ethyl (B) 139 (H) <5 mg/dL    Comment:        LOWEST DETECTABLE LIMIT FOR SERUM ALCOHOL IS 5 mg/dL FOR MEDICAL PURPOSES ONLY   Salicylate level     Status: None   Collection Time: 01/04/16  9:53 AM  Result Value Ref Range   Salicylate Lvl <4.1 2.8 -  30.0 mg/dL  Acetaminophen level     Status: Abnormal   Collection Time: 01/04/16  9:53 AM  Result Value Ref Range   Acetaminophen (Tylenol), Serum <10 (L) 10 - 30 ug/mL    Comment:        THERAPEUTIC CONCENTRATIONS VARY SIGNIFICANTLY. A RANGE OF 10-30 ug/mL MAY BE AN EFFECTIVE CONCENTRATION FOR MANY PATIENTS. HOWEVER, SOME ARE BEST TREATED AT CONCENTRATIONS OUTSIDE THIS RANGE. ACETAMINOPHEN CONCENTRATIONS >150 ug/mL AT 4 HOURS AFTER INGESTION AND >50 ug/mL AT 12 HOURS AFTER INGESTION ARE OFTEN ASSOCIATED WITH TOXIC REACTIONS.   CBC     Status: Abnormal   Collection Time: 01/04/16  9:53 AM  Result Value Ref Range   WBC 14.4 (H) 3.8 - 10.6 K/uL   RBC 5.51 4.40 - 5.90 MIL/uL   Hemoglobin 18.5 (H) 13.0 - 18.0 g/dL   HCT 52.6 (H) 40.0 - 52.0 %   MCV 95.5 80.0 - 100.0 fL   MCH 33.5 26.0 - 34.0 pg   MCHC 35.1 32.0 - 36.0 g/dL   RDW 12.8 11.5 - 14.5 %   Platelets 180 150 - 440 K/uL  Rapid HIV screen (HIV 1/2 Ab+Ag)     Status: None   Collection Time: 01/04/16  9:53 AM  Result Value Ref Range   HIV-1 P24 Antigen - HIV24 NON REACTIVE NON REACTIVE   HIV 1/2 Antibodies NON REACTIVE NON REACTIVE   Interpretation (HIV Ag Ab)      A non reactive test result means that HIV 1 or HIV 2 antibodies and HIV 1 p24 antigen were not detected in the specimen.  Urine Drug Screen, Qualitative ([facility] only)     Status: Abnormal   Collection Time: 01/04/16  2:00 PM  Result Value Ref Range   Tricyclic, Ur Screen NONE DETECTED NONE  DETECTED   Amphetamines, Ur Screen NONE DETECTED NONE DETECTED   MDMA (Ecstasy)Ur Screen NONE DETECTED NONE DETECTED   Cocaine Metabolite,Ur [place] NONE DETECTED NONE DETECTED   Opiate, Ur Screen NONE DETECTED NONE DETECTED   Phencyclidine (PCP) Ur S NONE DETECTED NONE DETECTED   Cannabinoid 50 Ng, Ur [place] POSITIVE (A) NONE DETECTED   Barbiturates, Ur Screen NONE DETECTED NONE DETECTED   Benzodiazepine, Ur Scrn NONE DETECTED NONE DETECTED   Methadone Scn, Ur NONE DETECTED NONE DETECTED    Comment: (NOTE) 941  Tricyclics, urine               Cutoff 1000 ng/mL 200  Amphetamines, urine             Cutoff 1000 ng/mL 300  MDMA (Ecstasy), urine           Cutoff 500 ng/mL 400  Cocaine Metabolite, urine       Cutoff 300 ng/mL 500  Opiate, urine                   Cutoff 300 ng/mL 600  Phencyclidine (PCP), urine      Cutoff 25 ng/mL 700  Cannabinoid, urine              Cutoff 50 ng/mL 800  Barbiturates, urine             Cutoff 200 ng/mL 900  Benzodiazepine, urine           Cutoff 200 ng/mL 1000 Methadone, urine                Cutoff 300 ng/mL 1100 1200 The urine drug screen provides only a preliminary, unconfirmed 1300 analytical test  result and should not be used for non-medical 1400 purposes. Clinical consideration and professional judgment should 1500 be applied to any positive drug screen result due to possible 1600 interfering substances. A more specific alternate chemical method 1700 must be used in order to obtain a confirmed analytical result.  1800 Gas chromato graphy / mass spectrometry (GC/MS) is the preferred 1900 confirmatory method.   [place] rt PCR ([place] only)     Status: None   Collection Time: 01/04/16  2:00 PM  Result Value Ref Range   Specimen source GC/Chlam URINE, RANDOM    Chlamydia Tr NOT DETECTED NOT DETECTED   N gonorrhoeae NOT DETECTED NOT DETECTED    Comment: (NOTE) 100  This methodology has not been evaluated in pregnant women or in 200  patients with a  history of hysterectomy. 300 400  This methodology will not be performed on patients less than 51  years of age.     Blood Alcohol level:  Lab Results  Component Value Date   [facility] 139* 07/62/2633    Metabolic Disorder Labs:  No results found for: HGBA1C, MPG No results found for: PROLACTIN No results found for: CHOL, TRIG, HDL, CHOLHDL, VLDL, LDLCALC  Current Medications: Current Facility-Administered Medications  Medication Dose Route Frequency Provider Last Rate Last Dose  . acetaminophen (TYLENOL) tablet 650 mg  650 mg Oral Q6H PRN Gonzella Lex, MD      . alum & mag hydroxide-simeth (MAALOX/MYLANTA) 200-200-20 MG/5ML suspension 30 mL  30 mL Oral Q4H PRN Gonzella Lex, MD      . ARIPiprazole (ABILIFY) tablet 15 mg  15 mg Oral Daily Hildred Priest, MD   15 mg at 01/05/16 1545  . chlordiazePOXIDE (LIBRIUM) capsule 10 mg  10 mg Oral TID Hildred Priest, MD   10 mg at 01/05/16 1538  . magnesium hydroxide (MILK OF MAGNESIA) suspension 30 mL  30 mL Oral Daily PRN Gonzella Lex, MD      . nicotine (NICODERM CQ - dosed in mg/24 hours) patch 21 mg  21 mg Transdermal Daily Hildred Priest, MD   21 mg at 01/05/16 1530  . traZODone (DESYREL) tablet 100 mg  100 mg Oral QHS Hildred Priest, MD       PTA Medications: Prescriptions prior to admission  Medication Sig Dispense Refill Last Dose  . busPIRone (BUSPAR) 5 MG tablet Take 1 tablet by mouth 2 (two) times daily.  1 unknown at unknown  . diclofenac (VOLTAREN) 75 MG EC tablet Take 1 tablet by mouth 2 (two) times daily with a meal.  0 unknown at unknown  . loratadine (CLARITIN) 10 MG tablet Take 10 mg by mouth daily as needed for allergies.   PRN at PRN    Musculoskeletal: Strength & Muscle Tone: within normal limits Gait & Station: normal Patient leans: N/A  Psychiatric Specialty Exam: Physical Exam  Constitutional: He is oriented to person, place, and time. He appears well-developed and  well-nourished.  HENT:  Head: Normocephalic and atraumatic.  Eyes: Conjunctivae and EOM are normal.  Neck: Normal range of motion.  Respiratory: Effort normal.  Musculoskeletal: Normal range of motion.  Neurological: He is alert and oriented to person, place, and time.    Review of Systems  Constitutional: Negative.   HENT: Negative.   Eyes: Negative.   Respiratory: Negative.   Cardiovascular: Negative.   Gastrointestinal: Negative.   Genitourinary: Negative.   Musculoskeletal: Negative.   Skin: Negative.   Neurological: Negative.   Endo/Heme/Allergies: Negative.  Psychiatric/Behavioral: Positive for depression and substance abuse. The patient is nervous/anxious and has insomnia.     Blood pressure 141/93, pulse 82, temperature 97.9 F (36.6 C), temperature source Oral, resp. rate 20, height 5' 9"  (1.753 m), SpO2 99 %.There is no weight on file to calculate BMI.  General Appearance: Well Groomed  Engineer, water::  Good  Speech:  Pressured  Volume:  Normal  Mood:  Anxious  Affect:  Appropriate  Thought Process:  Linear  Orientation:  Full (Time, Place, and Person)  Thought Content:  Hallucinations: None  Suicidal Thoughts:  Yes.  without intent/plan  Homicidal Thoughts:  No  Memory:  Immediate;   Good Recent;   Good Remote;   Good  Judgement:  Fair  Insight:  Fair  Psychomotor Activity:  Normal  Concentration:  Fair  Recall:  Good  Fund of Knowledge:Good  Language: Good  Akathisia:  No  Handed:    AIMS (if indicated):     Assets:  Agricultural consultant Housing Physical Health Social Support  ADL's:  Intact  Cognition: WNL  Sleep:      Treatment Plan Summary:  Bipolar d/o: appears to be hypomanic at this time.  Pt has agreed with a trail of abilify.  He will be started on abilify 15 mg po q day.  Insomnia: I will start trazodone 100 mg po qhs  Alcohol withdrawal: will start librium 10 mg tid.  Will check CIWA tid for 24 h.  Pt  denies h/o withdrawals.  Alcohol dep, cannabis dep: in need of treatment for substance abuse at discharge.  Tobacco use d/o: wills start nicotine 21 mg q day.  Labs: will order TSH, lipid panel, HbA1c, b12 and prolactin in am tomorrow.  No STD's, no HIV  Precautions q 15 m checks  VS bid  Hospitalization status: continue voluntary admission  Dispo: will be d/c home once stable  F/u: will need referral for [facility] or [facility] for substance abuse and psychiatric issues.  No insurance: will need referral to med management clinic  >90 minutes.  Records were reviewed.  Pt was interviewed. Labs reviewed  I certify that inpatient services furnished can reasonably be expected to improve the patient's condition.    Hildred Priest, MD 3/24/20174:04 PM

## 2016-01-06 LAB — LIPID PANEL
CHOL/HDL RATIO: 3.3 ratio
CHOLESTEROL: 151 mg/dL (ref 0–200)
HDL: 46 mg/dL (ref >40)
LDL CALC: 79 mg/dL (ref 0–99)
Triglycerides: 131 mg/dL (ref <150)
VLDL: 26 mg/dL (ref 0–40)

## 2016-01-06 LAB — TSH: TSH: 1.868 u[IU]/mL (ref 0.350–4.500)

## 2016-01-06 LAB — HEMOGLOBIN A1C: HEMOGLOBIN A1C: 4.9 % (ref 4.0–6.0)

## 2016-01-06 MED ORDER — LORATADINE 10 MG PO TABS
10.0000 mg | ORAL_TABLET | Freq: Every day | ORAL | Status: DC
  Administered 2016-01-06 – 2016-01-08 (×3): 10 mg via ORAL
  Filled 2016-01-06 (×3): qty 1

## 2016-01-06 NOTE — Progress Notes (Signed)
D:  Patient is alert and oriented on the unit this shift.  Patient did attend outside group today.  Patient denies suicidal ideation, homicidal ideation, auditory or visual hallucinations at the present time.  Patient's goal for today is to "continue to work on getting medication right for discharge."  Patient rates his depression a "3" and his anxiety an "8"  A:  Scheduled medications are administered to patient as per MD orders.  Emotional support and encouragement are provided.  Patient is maintained on q.15 minute safety checks.  Patient is informed to notify staff with questions or concerns. R:  No adverse medication reactions are noted.  Patient is cooperative with medication administration and treatment plan today.  Patient is receptive, calm and cooperative on the unit at this time.  Patient interacts well with others on the unit this shift.  Patient contracts for safety at this time.  Patient remains safe at this time.

## 2016-01-06 NOTE — [facility] Group Notes (Signed)
[facility] LCSW Group Therapy  01/06/2016 3:52 PM  Type of Therapy:  Group Therapy  Participation Level:  Minimal  Participation Quality:  Attentive  Affect:  Flat  Cognitive:  Alert  Insight:  Limited  Engagement in Therapy:  Limited  Modes of Intervention:  Discussion, Education, Socialization and Support  Summary of Progress/Problems: Pt will identify unhealthy thoughts and how they impact their emotions and behavior. Pt will be encouraged to discuss these thoughts, emotions and behaviors with the group. Pt attended group and stayed the entire time. He sat quietly and listened to other group members.   Rhian Funari L Khamari Yousuf MSW, LCSWA  01/06/2016, 3:52 PM   

## 2016-01-06 NOTE — Plan of Care (Signed)
Problem: Alteration in mood Goal: STG-Patient is able to discuss feelings and issues (Patient is able to discuss feelings and issues leading to depression)  Outcome: Progressing Patient is able to discuss his feelings this shift

## 2016-01-06 NOTE — Progress Notes (Signed)
D: Pt denies SI/HI/AVH. Pt is pleasant and cooperative. Pt stated he was doing much better this evening he has been getting sleep, talked to his wife and talked to Dr about starting back on Depakote to help with his mood swings.   A: Pt was offered support and encouragement. Pt was given scheduled medications. Pt was encourage to attend groups. Q 15 minute checks were done for safety.   R:Pt attends groups and interacts well with peers and staff. Pt is taking medication. Pt has no complaints at this time.Pt receptive to treatment and safety maintained on unit.

## 2016-01-06 NOTE — Progress Notes (Signed)
D: Pt denies SI/HI/AVH. Pt is pleasant and cooperative. Pt appears less anxious and he is interacting with peers and staff appropriately.  A: Pt was offered support and encouragement. Pt was given scheduled medications. Pt was encouraged to attend groups. Q 15 minute checks were done for safety.  R:Pt attends groups and interacts well with peers and staff. Pt is taking medication. Pt has no complaints.Pt receptive to treatment and safety maintained on unit.   

## 2016-01-06 NOTE — [facility] Group Notes (Signed)
[facility] Group Notes:  (Nursing/MHT/Case Management/Adjunct)  Date:  01/06/2016  Time:  3:37 AM  Type of Therapy:  Group Therapy  Participation Level:  Did Not Attend   Shiree Altemus Joy Chauntay Paszkiewicz 01/06/2016, 3:37 AM

## 2016-01-06 NOTE — Plan of Care (Signed)
Problem: Alteration in mood Goal: LTG-Patient reports reduction in suicidal thoughts (Patient reports reduction in suicidal thoughts and is able to verbalize a safety plan for whenever patient is feeling suicidal)  Outcome: Progressing Pt denies SI at this time     

## 2016-01-06 NOTE — Progress Notes (Signed)
Patient told this Clinical research associatewriter that his wife had just informed him over the phone that his mother was having a heart attack [place] [facility] [place] [facility] [place]  Patient asked if there was any way he could be discharged and how soon could he be discharged so he could go see his mother.  MD was made aware of the situation.  Per MD, patient will be informed that the doctor does not feel comfortable discharging him at this time for his own safety.  He can keep tabs on his mother's condition over the phone for now.

## 2016-01-06 NOTE — Plan of Care (Signed)
Problem: Alteration in mood Goal: LTG-Patient reports reduction in suicidal thoughts (Patient reports reduction in suicidal thoughts and is able to verbalize a safety plan for whenever patient is feeling suicidal)  Outcome: Progressing Patient denies SI.      

## 2016-01-06 NOTE — Plan of Care (Signed)
Problem: Ineffective individual coping Goal: LTG: Patient will report a decrease in negative feelings Outcome: Progressing Patient reports feeling better today than he did yesterday     

## 2016-01-06 NOTE — Progress Notes (Signed)
[facility] Progress Note  01/06/2016 12:32 PM Ok EdwardsRobert Nicholas Ardizzone  MRN:  [id number] Subjective:  Patient is a 35 year old Caucasian male with history of bipolar disorder and alcohol abuse was admitted with suicidal ideations. 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 States that he has recently been very stressed about his wife cheating on someone. States that he had been ruminating about this and not eating and sleeping well for several weeks. Per history of present illness patient had been carrying around a baseball bat with an intention to harm this person. This morning patient reports that he is restarted on his medication and he has begin to feel better. States that he is sleeping well and eating well. He denies any suicidal thoughts today. Denies any withdrawal symptoms. BP slightly high at 140/90 mm hg.  Principal Problem: Bipolar 2 disorder, major depressive episode (HCC) Diagnosis:   Patient Active Problem List   Diagnosis Date Noted  . Tobacco use disorder [F17.200] 01/05/2016  . Alcohol use disorder, severe, dependence (HCC) [F10.20] 01/05/2016  . Alcohol withdrawal (HCC) [F10.239] 01/05/2016  . Cannabis use disorder, moderate, dependence (HCC) [F12.20] 01/05/2016  . Bipolar 2 disorder, major depressive episode (HCC) [F31.81] 01/04/2016  . 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 Hernia, inguinal [K40.90] 12/22/2012   Total Time spent with patient: 20 minutes  Past Psychiatric History: Patient has a [place] history of bipolar disorder and polysubstance abuse with alcohol, cannabis and benzodiazepines Past Medical History:  Past Medical History  Diagnosis Date  . Depression    History reviewed. No pertinent past surgical history. Family History: History reviewed. No pertinent family history. Family Psychiatric  History:  Social History:  History  Alcohol Use  .  Yes     History  Drug Use  . Yes  . Special: Marijuana    Social History   Social History  . Marital Status: Married    Spouse Name: N/A  . Number of Children: N/A  . Years of Education: N/A   Social History Main Topics  . Smoking status: Current Every Day Smoker -- 1.00 packs/day    Types: Cigarettes  . Smokeless tobacco: None  . Alcohol Use: Yes  . Drug Use: Yes    Special: Marijuana  . Sexual Activity: Not Asked   Other Topics Concern  . None   Social History Narrative   Additional Social History:                         Sleep: Fair  Appetite:  Fair  Current Medications: Current Facility-Administered Medications  Medication Dose Route Frequency Provider Last Rate Last Dose  . acetaminophen (TYLENOL) tablet 650 mg  650 mg Oral Q6H PRN Audery AmelJohn T Clapacs, MD   650 mg at 01/06/16 0854  . alum & mag hydroxide-simeth (MAALOX/MYLANTA) 200-200-20 MG/5ML suspension 30 mL  30 mL Oral Q4H PRN Audery AmelJohn T Clapacs, MD      . ARIPiprazole (ABILIFY) tablet 15 mg  15 mg Oral Daily Jimmy FootmanAndrea Hernandez-Gonzalez, MD   15 mg at 01/06/16 0854  . chlordiazePOXIDE (LIBRIUM) capsule 10 mg  10 mg Oral TID Jimmy FootmanAndrea Hernandez-Gonzalez, MD   10 mg at 01/06/16 0854  . magnesium hydroxide (MILK OF MAGNESIA) suspension 30 mL  30 mL Oral Daily PRN Audery AmelJohn T Clapacs, MD      . nicotine (NICODERM CQ - dosed in mg/24 hours)  patch 21 mg  21 mg Transdermal Daily Jimmy Footman, MD   21 mg at 01/06/16 1053  . traZODone (DESYREL) tablet 100 mg  100 mg Oral QHS Jimmy Footman, MD   100 mg at 01/05/16 2150    Lab Results:  Results for orders placed or performed during the hospital encounter of 01/05/16 (from the past 48 hour(s))  Urine Drug Screen, Qualitative ([facility] only)     Status: Abnormal   Collection Time: 01/05/16  6:53 PM  Result Value Ref Range   Tricyclic, Ur Screen NONE DETECTED NONE DETECTED   Amphetamines, Ur Screen NONE DETECTED NONE DETECTED   MDMA (Ecstasy)Ur Screen  NONE DETECTED NONE DETECTED   Cocaine Metabolite,Ur [place] NONE DETECTED NONE DETECTED   Opiate, Ur Screen NONE DETECTED NONE DETECTED   Phencyclidine (PCP) Ur S NONE DETECTED NONE DETECTED   Cannabinoid 50 Ng, Ur  POSITIVE (A) NONE DETECTED   Barbiturates, Ur Screen NONE DETECTED NONE DETECTED   Benzodiazepine, Ur Scrn POSITIVE (A) NONE DETECTED   Methadone Scn, Ur NONE DETECTED NONE DETECTED    Comment: (NOTE) 100  Tricyclics, urine               Cutoff 1000 ng/mL 200  Amphetamines, urine             Cutoff 1000 ng/mL 300  MDMA (Ecstasy), urine           Cutoff 500 ng/mL 400  Cocaine Metabolite, urine       Cutoff 300 ng/mL 500  Opiate, urine                   Cutoff 300 ng/mL 600  Phencyclidine (PCP), urine      Cutoff 25 ng/mL 700  Cannabinoid, urine              Cutoff 50 ng/mL 800  Barbiturates, urine             Cutoff 200 ng/mL 900  Benzodiazepine, urine           Cutoff 200 ng/mL 1000 Methadone, urine                Cutoff 300 ng/mL 1100 1200 The urine drug screen provides only a preliminary, unconfirmed 1300 analytical test result and should not be used for non-medical 1400 purposes. Clinical consideration and professional judgment should 1500 be applied to any positive drug screen result due to possible 1600 interfering substances. A more specific alternate chemical method 1700 must be used in order to obtain a confirmed analytical result.  1800 Gas chromato graphy / mass spectrometry (GC/MS) is the preferred 1900 confirmatory method.   TSH     Status: None   Collection Time: 01/06/16  7:26 AM  Result Value Ref Range   TSH 1.868 0.350 - 4.500 uIU/mL  Lipid panel     Status: None   Collection Time: 01/06/16  7:26 AM  Result Value Ref Range   Cholesterol 151 0 - 200 mg/dL   Triglycerides 161 <096 mg/dL   HDL 46 >04 mg/dL   Total CHOL/HDL Ratio 3.3 RATIO   VLDL 26 0 - 40 mg/dL   LDL Cholesterol 79 0 - 99 mg/dL    Comment:        Total Cholesterol/HDL:CHD  Risk Coronary Heart Disease Risk Table                     Men   Women  1/2 Average Risk   3.4  3.3  Average Risk       5.0   4.4  2 X Average Risk   9.6   7.1  3 X Average Risk  23.4   11.0        Use the calculated Patient Ratio above and the CHD Risk Table to determine the patient's CHD Risk.        ATP III CLASSIFICATION (LDL):  <100     mg/dL   Optimal  161-096  mg/dL   Near or Above                    Optimal  130-159  mg/dL   Borderline  045-409  mg/dL   High  >811     mg/dL   Very High     Blood Alcohol level:  Lab Results  Component Value Date   [facility] 139* 01/04/2016    Physical Findings: AIMS:  , ,  ,  ,    CIWA:    COWS:     Musculoskeletal: Strength & Muscle Tone: within normal limits Gait & Station: normal Patient leans: N/A  Psychiatric Specialty Exam: Review of Systems  Constitutional: Negative.   HENT: Negative.   Eyes: Negative.   Respiratory: Negative.   Cardiovascular: Negative.   Gastrointestinal: Negative.   Musculoskeletal: Negative.   Skin: Negative.   Neurological: Negative.   Endo/Heme/Allergies: Negative.   Psychiatric/Behavioral: Positive for depression and suicidal ideas.    Blood pressure 146/90, pulse 89, temperature 97.8 F (36.6 C), temperature source Oral, resp. rate 20, height  (1.753 m), SpO2 99 %.There is no weight on file to calculate BMI.  General Appearance: Casual  Eye Contact::  Fair  Speech:  Clear and Coherent  Volume:  Increased  Mood:  Depressed and Dysphoric  Affect:  Congruent  Thought Process:  Circumstantial  Orientation:  Full (Time, Place, and Person)  Thought Content:  Rumination  Suicidal Thoughts:  No  Homicidal Thoughts:  No  Memory:  Immediate;   Fair Recent;   Fair Remote;   Fair  Judgement:  Impaired  Insight:  Shallow  Psychomotor Activity:  Decreased  Concentration:  Fair  Recall:  [employer] of Knowledge:Fair  Language: Fair  Akathisia:  No  Handed:  Right  AIMS (if  indicated):     Assets:  Communication Skills Desire for Improvement Financial Resources/Insurance Housing Vocational/Educational  ADL's:  Intact  Cognition: WNL  Sleep:  Number of Hours: 7.75   Treatment Plan Summary: Daily contact with patient to assess and evaluate symptoms and progress in treatment, Medication management and Plan as below   Bipolar d/o: appears to be hypomanic at this time. Pt has agreed with a trail of abilify. He will be started on abilify 15 mg po q day.  Insomnia: I will start trazodone 100 mg po qhs  Alcohol withdrawal: will start librium 10 mg tid. Will check CIWA tid for 24 h. Pt denies h/o withdrawals.  Alcohol dep, cannabis dep: in need of treatment for substance abuse at discharge.  Tobacco use d/o: wills start nicotine 21 mg q day.  Labs: Reviewed. TSH, lipid panel in normal limits. UDS positive for cannabis and benzodiazepines.  Precautions q 15 m checks  VS bid  Hospitalization status: continue voluntary admission  Dispo: will be d/c home once stable  F/u: will need referral for [facility] or [facility] for substance abuse and psychiatric issues.  No insurance: will need referral to med management clinic  Patrick North, MD 01/06/2016, 12:32 PM

## 2016-01-07 DIAGNOSIS — F3181 Bipolar II disorder: Principal | ICD-10-CM

## 2016-01-07 LAB — PROLACTIN: PROLACTIN: 10.7 ng/mL (ref 4.0–15.2)

## 2016-01-07 MED ORDER — ARIPIPRAZOLE 10 MG PO TABS
10.0000 mg | ORAL_TABLET | Freq: Every day | ORAL | Status: DC
  Administered 2016-01-08: 10 mg via ORAL
  Filled 2016-01-07: qty 1

## 2016-01-07 MED ORDER — DIVALPROEX SODIUM 250 MG PO DR TAB
250.0000 mg | DELAYED_RELEASE_TABLET | Freq: Two times a day (BID) | ORAL | Status: DC
  Administered 2016-01-07 – 2016-01-08 (×2): 250 mg via ORAL
  Filled 2016-01-07 (×2): qty 1

## 2016-01-07 NOTE — Progress Notes (Signed)
Patient ID: Brett [name], male   DOB: 11/07/1980, 35 y.o.   MRN: [id number] [facility] MD Progress Note  01/07/2016 11:22 AM Daryon Remmert  MRN:  [id number] Subjective:  Patient is a 35 year old Caucasian male with history of bipolar disorder and alcohol abuse was admitted with suicidal ideations. 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 States that he has recently been very stressed about his wife cheating on someone. States that he had been ruminating about this and not eating and sleeping well for several weeks.   Patient seen this morning. He reports being upset about his mother having a heart attack and being hospitalized. States he is not sleeping as well due to the disturbances with noises at night, but resting well overall. Feels his mood is improving , would like to switch the Abilify to depakote since he cannot afford the abilify, costs 900 dollars a month. States depakote had worked well for him in the past. 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 Denies sI today.  Principal Problem: Bipolar 2 disorder, major depressive episode (HCC) Diagnosis:   Patient Active Problem List   Diagnosis Date Noted  . Tobacco use disorder [F17.200] 01/05/2016  . Alcohol use disorder, severe, dependence (HCC) [F10.20] 01/05/2016  . Alcohol withdrawal (HCC) [F10.239] 01/05/2016  . Cannabis use disorder, moderate, dependence (HCC) [F12.20] 01/05/2016  . Bipolar 2 disorder, major depressive episode (HCC) [F31.81] 01/04/2016  . 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 Hernia, inguinal [K40.90] 12/22/2012   Total Time spent with patient: 20 minutes  Past Psychiatric History: Patient has a [place] history of bipolar disorder and polysubstance abuse with alcohol, cannabis and benzodiazepines Past Medical History:  Past  Medical History  Diagnosis Date  . Depression    History reviewed. No pertinent past surgical history. Family History: History reviewed. No pertinent family history. Family Psychiatric  History:  Social History:  History  Alcohol Use  . Yes     History  Drug Use  . Yes  . Special: Marijuana    Social History   Social History  . Marital Status: Married    Spouse Name: N/A  . Number of Children: N/A  . Years of Education: N/A   Social History Main Topics  . Smoking status: Current Every Day Smoker -- 1.00 packs/day    Types: Cigarettes  . Smokeless tobacco: None  . Alcohol Use: Yes  . Drug Use: Yes    Special: Marijuana  . Sexual Activity: Not Asked   Other Topics Concern  . None   Social History Narrative   Additional Social History:                         Sleep: Fair  Appetite:  Fair  Current Medications: Current Facility-Administered Medications  Medication Dose Route Frequency Provider Last Rate Last Dose  . acetaminophen (TYLENOL) tablet 650 mg  650 mg Oral Q6H PRN Audery Amel, MD   650 mg at 01/06/16 0854  . alum & mag hydroxide-simeth (MAALOX/MYLANTA) 200-200-20 MG/5ML suspension 30 mL  30 mL Oral Q4H PRN Audery Amel, MD      . ARIPiprazole (ABILIFY) tablet 15 mg  15 mg Oral Daily Jimmy Footman, MD   15 mg at 01/07/16 1013  . chlordiazePOXIDE (LIBRIUM) capsule 10 mg  10 mg Oral TID Jimmy Footman, MD   10 mg at 01/07/16 1013  . loratadine (CLARITIN) tablet 10 mg  10 mg Oral Daily Jaisha Villacres, MD   10 mg at 01/07/16 1013  . magnesium hydroxide (MILK OF MAGNESIA) suspension 30 mL  30 mL Oral Daily PRN Audery Amel, MD      . nicotine (NICODERM CQ - dosed in mg/24 hours) patch 21 mg  21 mg Transdermal Daily Jimmy Footman, MD   21 mg at 01/07/16 1013  . traZODone (DESYREL) tablet 100 mg  100 mg Oral QHS Jimmy Footman, MD   100 mg at 01/06/16 2146    Lab Results:  Results for orders placed  or performed during the hospital encounter of 01/05/16 (from the past 48 hour(s))  Urine Drug Screen, Qualitative ([facility] only)     Status: Abnormal   Collection Time: 01/05/16  6:53 PM  Result Value Ref Range   Tricyclic, Ur Screen NONE DETECTED NONE DETECTED   Amphetamines, Ur Screen NONE DETECTED NONE DETECTED   MDMA (Ecstasy)Ur Screen NONE DETECTED NONE DETECTED   Cocaine Metabolite,Ur [place] NONE DETECTED NONE DETECTED   Opiate, Ur Screen NONE DETECTED NONE DETECTED   Phencyclidine (PCP) Ur S NONE DETECTED NONE DETECTED   Cannabinoid 50 Ng, Ur [place] POSITIVE (A) NONE DETECTED   Barbiturates, Ur Screen NONE DETECTED NONE DETECTED   Benzodiazepine, Ur Scrn POSITIVE (A) NONE DETECTED   Methadone Scn, Ur NONE DETECTED NONE DETECTED    Comment: (NOTE) 100  Tricyclics, urine               Cutoff 1000 ng/mL 200  Amphetamines, urine             Cutoff 1000 ng/mL 300  MDMA (Ecstasy), urine           Cutoff 500 ng/mL 400  Cocaine Metabolite, urine       Cutoff 300 ng/mL 500  Opiate, urine                   Cutoff 300 ng/mL 600  Phencyclidine (PCP), urine      Cutoff 25 ng/mL 700  Cannabinoid, urine              Cutoff 50 ng/mL 800  Barbiturates, urine             Cutoff 200 ng/mL 900  Benzodiazepine, urine           Cutoff 200 ng/mL 1000 Methadone, urine                Cutoff 300 ng/mL 1100 1200 The urine drug screen provides only a preliminary, unconfirmed 1300 analytical test result and should not be used for non-medical 1400 purposes. Clinical consideration and professional judgment should 1500 be applied to any positive drug screen result due to possible 1600 interfering substances. A more specific alternate chemical method 1700 must be used in order to obtain a confirmed analytical result.  1800 Gas chromato graphy / mass spectrometry (GC/MS) is the preferred 1900 confirmatory method.   TSH     Status: None   Collection Time: 01/06/16  7:26 AM  Result Value Ref Range   TSH 1.868 0.350 -  4.500 uIU/mL  Lipid panel     Status: None   Collection Time: 01/06/16  7:26 AM  Result Value Ref Range   Cholesterol 151 0 - 200 mg/dL   Triglycerides 161 <096 mg/dL   HDL 46 >04 mg/dL   Total CHOL/HDL  Ratio 3.3 RATIO   VLDL 26 0 - 40 mg/dL   LDL Cholesterol 79 0 - 99 mg/dL    Comment:        Total Cholesterol/HDL:CHD Risk Coronary Heart Disease Risk Table                     Men   Women  1/2 Average Risk   3.4   3.3  Average Risk       5.0   4.4  2 X Average Risk   9.6   7.1  3 X Average Risk  23.4   11.0        Use the calculated Patient Ratio above and the CHD Risk Table to determine the patient's CHD Risk.        ATP III CLASSIFICATION (LDL):  <100     mg/dL   Optimal  161-096100-129  mg/dL   Near or Above                    Optimal  130-159  mg/dL   Borderline  045-409160-189  mg/dL   High  >811>190     mg/dL   Very High   Hemoglobin A1c     Status: None   Collection Time: 01/06/16  7:26 AM  Result Value Ref Range   Hgb A1c MFr Bld 4.9 4.0 - 6.0 %  Prolactin     Status: None   Collection Time: 01/06/16  7:26 AM  Result Value Ref Range   Prolactin 10.7 4.0 - 15.2 ng/mL    Comment: (NOTE) Performed At: [facility] [address] [place] [id number] Mila HomerHancock William F MD OZ:[id number]:[phone]     Blood Alcohol level:  Lab Results  Component Value Date   [facility] 139* 01/04/2016    Physical Findings: AIMS:  , ,  ,  ,    CIWA:    COWS:     Musculoskeletal: Strength & Muscle Tone: within normal limits Gait & Station: normal Patient leans: N/A  Psychiatric Specialty Exam: Review of Systems  Constitutional: Negative.   HENT: Negative.   Eyes: Negative.   Respiratory: Negative.   Cardiovascular: Negative.   Gastrointestinal: Negative.   Musculoskeletal: Negative.   Skin: Negative.   Neurological: Negative.   Endo/Heme/Allergies: Negative.   Psychiatric/Behavioral: Positive for depression and suicidal ideas.    Blood pressure 132/93, pulse 104, temperature 98.6  F (37 C), temperature source Oral, resp. rate 20, height 5\' 9"  (1.753 m), SpO2 99 %.There is no weight on file to calculate BMI.  General Appearance: Casual  Eye Contact::  Fair  Speech:  Clear and Coherent  Volume:  Increased  Mood:  Depressed and Dysphoric  Affect:  Congruent  Thought Process:  Circumstantial  Orientation:  Full (Time, Place, and Person)  Thought Content:  Rumination  Suicidal Thoughts:  No  Homicidal Thoughts:  No  Memory:  Immediate;   Fair Recent;   Fair Remote;   Fair  Judgement:  Impaired  Insight:  Shallow  Psychomotor Activity:  Decreased  Concentration:  Fair  Recall:  [employer]  Fund of Knowledge:Fair  Language: Fair  Akathisia:  No  Handed:  Right  AIMS (if indicated):     Assets:  Communication Skills Desire for Improvement Financial Resources/Insurance Housing Vocational/Educational  ADL's:  Intact  Cognition: WNL  Sleep:  Number of Hours: 7.15   Treatment Plan Summary: Daily contact with patient to assess and evaluate symptoms and progress in treatment, Medication  management and Plan as below   Bipolar d/o: Mood appears stable. Taper abilify to  and start Depakote at  po bid.   Insomnia: I will start trazodone 100 mg po qhs  Alcohol withdrawal: will start librium 10 mg tid. Will check CIWA tid for 24 h. Pt denies h/o withdrawals.  Alcohol dep, cannabis dep: in need of treatment for substance abuse at discharge.  Tobacco use d/o: wills start nicotine 21 mg q day.  Labs: Reviewed. TSH, lipid panel in normal limits. UDS positive for cannabis and benzodiazepines.  Precautions q 15 m checks  VS bid  Hospitalization status: continue voluntary admission  Dispo: will be d/c home once stable  F/u: will need referral for [facility] or [facility] for substance abuse and psychiatric issues.  No insurance: will need referral to med management clinic     Patrick North, MD 01/07/2016, 11:22 AM

## 2016-01-07 NOTE — [facility] Group Notes (Signed)
[facility] LCSW Group Therapy  01/07/2016 2:36 PM  Type of Therapy:  Group Therapy  Participation Level:  Active  Participation Quality:  Appropriate  Affect:  Appropriate  Cognitive:  Alert  Insight:  Improving  Engagement in Therapy:  Improving  Modes of Intervention:  Discussion, Education, Socialization and Support  Summary of Progress/Problems: Balance in life: Patients will discuss the concept of balance and how it looks and feels to be unbalanced. Pt will identify areas in their life that is unbalanced and ways to become more balanced.  Pt attended group and stayed the entire time. He discussed stressors such as family responsibilities, separation from wife and lack of self care. He states he has a manic episode every 3 years.   Sempra EnergyCandace L Mackenzie Lia MSW, LCSWA  01/07/2016, 2:36 PM

## 2016-01-07 NOTE — [facility] Suicide Risk Assessment (Signed)
[facility] INPATIENT:  Family/Significant Other Suicide Prevention Education  Suicide Prevention Education:  Education Completed Eli HoseBrittany Karstens (sister) [phone] ;has been identified by the patient as the family member/significant other with whom the patient will be residing, and identified as the person(s) who will aid the patient in the event of a mental health crisis (suicidal ideations/suicide attempt).  With written consent from the patient, the family member/significant other has been provided the following suicide prevention education, prior to the and/or following the discharge of the patient.  The suicide prevention education provided includes the following:  Suicide risk factors  Suicide prevention and interventions  National Suicide Hotline telephone number  [facility] assessment telephone number  [facility] Emergency Assistance 911  [facility] and/or [facility]idential Mobile Crisis Unit telephone number  Request made of family/significant other to:  Remove weapons (e.g., guns, rifles, knives), all items previously/currently identified as safety concern.    Remove drugs/medications (over-the-counter, prescriptions, illicit drugs), all items previously/currently identified as a safety concern.  The family member/significant other verbalizes understanding of the suicide prevention education information provided.  The family member/significant other agrees to remove the items of safety concern listed above.  Savannha Welle L Maximilian Tallo MSW, LCSWA  01/07/2016, 3:50 PM

## 2016-01-07 NOTE — Plan of Care (Signed)
Problem: Diagnosis: Increased Risk For Suicide Attempt Goal: LTG-Patient Will Show Positive Response to Medication LTG (by discharge) : Patient will show positive response to medication and will participate in the development of the discharge plan.  Outcome: Progressing Patient is medication compliant at this time

## 2016-01-07 NOTE — Plan of Care (Signed)
Problem: Alteration in mood; excessive anxiety as evidenced by: Goal: LTG-Patient's behavior demonstrates decreased anxiety (Patient's behavior demonstrates anxiety and he/she is utilizing learned coping skills to deal with anxiety-producing situations)  Outcome: Progressing Pt interacting on unit with peers, watching sporting event appeared to have decreased anxiety  Problem: Ineffective individual coping Goal: STG: Patient will remain free from self harm Outcome: Progressing Pt safe on the unit at this time

## 2016-01-07 NOTE — Progress Notes (Signed)
D: Pt denies SI/HI/AVH. Pt is pleasant and cooperative. Pt stated he was happy he was started on the Depakote. Pt stated he was ready to leave. Pt says he's going back home. Pt concerned about his job, pt may need a note for the time missed from work.  A: Pt was offered support and encouragement. Pt was given scheduled medications. Pt was encourage to attend groups. Q 15 minute checks were done for safety.   R:Pt attends groups and interacts well with peers and staff. Pt is taking medication . Pt has no complaints at this time .Pt receptive to treatment and safety maintained on unit.

## 2016-01-07 NOTE — Progress Notes (Signed)
D: Pt denies SI/HI/AVH. Pt is pleasant and cooperative. Pt appears less anxious and he is interacting with peers and staff appropriately.  A: Pt was offered support and encouragement. Pt was given scheduled medications. Pt was encouraged to attend groups. Q 15 minute checks were done for safety.  R:Pt attends groups and interacts well with peers and staff. Pt is taking medication. Pt has no complaints.Pt receptive to treatment and safety maintained on unit.   

## 2016-01-07 NOTE — BHH Counselor (Signed)
Adult Comprehensive Assessment  Patient ID: Brett [name], male   DOB: September 11, 1981, 35 y.o.   MRN: [id number]  Information Source: Information source: Patient  Current Stressors:  Educational / Learning stressors: Pt plans to return to school soon.  Employment / Job issues: Pt is employed.  Family Relationships: Currently separated from wife, mother is currently hospitalized due to heart attack.  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 He reports implusivly spending money.  Housing / Lack of housing: None reported  Physical health (include injuries & life threatening diseases): None reported  Social relationships: None reported  Substance abuse: Pt reports increased alcohol use over the last 4-5 months.  Bereavement / Loss: None reported   Living/Environment/Situation:  Living Arrangements: Spouse/significant other, Children Living conditions (as described by patient or guardian): Good  How Leonor has patient lived in current situation?: Few years  What is atmosphere in current home: Comfortable, Loving, Supportive  Family History:  Marital status: Separated Separated, when?: 5 months ago.  What types of issues is patient dealing with in the relationship?: She had an affair.  Additional relationship information: Pt reports they are trying to work on their relationship.  Are you sexually active?: Yes What is your sexual orientation?: Heterosexual  Has your sexual activity been affected by drugs, alcohol, medication, or emotional stress?: None reported  Does patient have children?: Yes How many children?: 3 How is patient's relationship with their children?: ages 32, 36 and 62 years old; good relationship.   Childhood History:  By whom was/is the patient raised?: Both parents Additional childhood history information: Parents are divorced.  Description of patient's relationship with caregiver  when they were a child: Good relationship.  Patient's description of current relationship with people who raised him/her: Good relationship.  How were you disciplined when you got in trouble as a child/adolescent?: None reported  Does patient have siblings?: Yes Number of Siblings: 2 Description of patient's current relationship with siblings: sister and half brother; good relationship.  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  Alcohol/Substance Abuse Treatment Hx: Past Tx, Inpatient, Past Tx, Outpatient If yes, describe treatment: [facility]  Has alcohol/substance abuse ever caused legal problems?: No  Social Support System:   Patient's Community Support System: Good Describe Community Support System: family  Type of faith/religion: Christianity  How does patient's faith help to cope with current illness?: prayer, comfort    Leisure/Recreation:  Leisure and Hobbies: golfing   Strengths/Needs:   What things does the patient do well?: job In what areas does patient struggle / problems for patient: separated from wife, financial issues, increase in alcohol use.   Discharge Plan:   Does patient have access to transportation?: Yes Will patient be returning to same living situation after discharge?: Yes Currently receiving community mental health services: No If no, would patient like referral for services when discharged?: Yes (What county?) [place] Does patient have financial barriers related to discharge medications?: Yes Patient description of barriers related to discharge medications: No insurance.   Summary/Recommendations:    Patient is a 35 year old male admitted  with a diagnosis of Bipolar 2 disorder. Patient presented to the hospital with depression, SI and alcohol use. Patient reports primary triggers for admission were alcohol use and medication non compliance.  He reports increase in alcohol use, sleep changes, impulsive spending and relationship issues. Patient will benefit from crisis stabilization, medication evaluation, group therapy and psycho education in addition to case management for discharge. At discharge, it is recommended that patient remain compliant with established discharge plan and continued treatment.   Quindon Denker L Lashanti Chambless. MSW, [facility]  01/07/2016

## 2016-01-07 NOTE — BHH Group Notes (Signed)
BHH Group Notes:  (Nursing/MHT/Case Management/Adjunct)  Date:  01/07/2016  Time:  1:44 AM  Type of Therapy:  Group Therapy  Participation Level:  Active  Participation Quality:  Appropriate  Affect:  Appropriate  Cognitive:  Appropriate  Insight:  Appropriate  Engagement in Group:  Engaged  Modes of Intervention:  n/a  Summary of Progress/Problems:  Brett [name] 01/07/2016, 1:44 AM

## 2016-01-08 MED ORDER — ARIPIPRAZOLE 15 MG PO TABS: 15.0000 mg | ORAL_TABLET | Freq: Every day | ORAL | Status: DC

## 2016-01-08 MED ORDER — TRAZODONE HCL 100 MG PO TABS: 100.0000 mg | ORAL_TABLET | Freq: Every day | ORAL | Status: DC

## 2016-01-08 NOTE — BHH Suicide Risk Assessment (Signed)
[facility]   Principal Problem: Bipolar 2 disorder, major depressive episode [facility](HCC) Discharge Diagnoses:  Patient Active Problem List   Diagnosis Date Noted  . Tobacco use disorder [F17.200] 01/05/2016  . Alcohol use disorder, severe, dependence (HCC) [F10.20] 01/05/2016  . Alcohol withdrawal (HCC) [F10.239] 01/05/2016  . Cannabis use disorder, moderate, dependence (HCC) [F12.20] 01/05/2016  . Bipolar 2 disorder, major depressive episode (HCC) [F31.81] 01/04/2016  . 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 Hernia, inguinal [K40.90] 12/22/2012    Psychiatric Specialty Exam: ROS  Blood pressure 129/82, pulse 104, temperature 97.8 F (36.6 C), temperature source Oral, resp. rate 20, height 5\' 9"  (1.753 m), SpO2 99 %.There is no weight on file to calculate BMI.                                                       Mental Status Per Nursing Assessment::   On Admission:     Demographic Factors:  Caucasian  Loss Factors: relational problems with wife  Historical Factors: Impulsivity  Risk Reduction Factors:   Responsible for children under 36 years of age, Sense of responsibility to family, Religious beliefs about death and Positive social support  Continued Clinical Symptoms:  Alcohol/Substance Abuse/Dependencies Previous Psychiatric Diagnoses and Treatments  Cognitive Features That Contribute To Risk:  None    Suicide Risk:  Minimal: No identifiable suicidal ideation.  Patients presenting with no risk factors but with morbid ruminations; may be classified as minimal risk based on the severity of the depressive symptoms  Follow-up Information    Follow up with [name], Brett PATRICIA, DO. Schedule an appointment as soon as possible for a visit in 2 weeks.   Specialty:  Family Medicine   Contact information:   [address] [place]  [id number] [phone]       [name]  In 3 days.   Why:  Your [name] follow up appointment will be walk in. Walk in hours are Monday- Friday between 8:00am and 10:00am.    Contact information:   [address]  [place] [id number] Phone: [phone] Fax: [phone]       Jimmy FootmanHernandez-Gonzalez,  Tierre Gerard, MD 01/08/2016, 9:59 AM

## 2016-01-08 NOTE — Plan of Care (Signed)
Problem: Alteration in mood; excessive anxiety as evidenced by: Goal: STG-Pt will report an absence of self-harm thoughts/actions (Patient will report an absence of self-harm thoughts or actions)  Outcome: Progressing Patient reports no thoughts of self harm at the current time

## 2016-01-08 NOTE — Progress Notes (Signed)
D:  Patient expresses readiness for discharge today.  Patient denies any pain currently.  Patient denies suicidal ideation, homicidal ideation, auditory or visual hallucinations currently.   A:  Medications and instructions for their use were reviewed with the patient and he voiced understanding.  A 7-day supply was sent with the patient on discharge.  Discharge instructions and follow up were reviewed with the patient.  Patient's belongings were returned upon him leaving the unit. R:  Patient signed for the return of his belongings.  Patient was cooperative with the discharge process.  Patient expressed understanding of discharge instructions, follow up, medications and their use.  Patient was escorted off the unit.  Patient remains safe at the time of discharge.

## 2016-01-08 NOTE — BHH Group Notes (Signed)
BHH Group Notes:  (Nursing/MHT/Case Management/Adjunct)  Date:  01/08/2016  Time:  4:27 AM  Type of Therapy:  Group Therapy  Participation Level:  Active  Participation Quality:  Appropriate  Affect:  Appropriate  Cognitive:  Appropriate  Insight:  Good  Engagement in Group:  Engaged  Modes of Intervention:  n/a  Summary of Progress/Problems:  Veva Holesshley Imani Kupono Marling 01/08/2016, 4:27 AM

## 2016-01-08 NOTE — Tx Team (Signed)
Interdisciplinary Treatment Plan Update (Adult)  Date:  01/08/2016 Time Reviewed:  11:11 AM  Progress in Treatment: Attending groups: Yes. Participating in groups:  Yes. Taking medication as prescribed:  Yes. Tolerating medication:  Yes. Family/Significant othe contact made:  Yes, individual(s) contacted:  patient's sister Patient understands diagnosis:  Yes. Discussing patient identified problems/goals with staff:  Yes. Medical problems stabilized or resolved:  Yes. Denies suicidal/homicidal ideation: Yes. Issues/concerns per patient self-inventory:  Yes. Other:  New problem(s) identified: No, Describe:  none reported  Discharge Plan or Barriers: Patient will stabilized and discharge home with outpatient follow up for substance abuse and medication management.   Reason for Continuation of Hospitalization: Depression Medication stabilization Suicidal ideation Withdrawal symptoms  Comments:  Estimated length of stay: 0 days, will discharge today Monday 01/08/16  New goal(s):  Review of initial/current patient goals per problem list:   1.  Goal(s): Participate in aftercare plan   Met:  Yes  Target date: by discharge  As evidenced by: patient will participate in aftercare plan AEB aftercare provider and housing plan identified at discharge 01/08/16: Patient will return home and has outpatient follow up identified. Goal met.   2.  Goal (s): Decrease depression   Met:  Yes  Target date: by discharge  As evidenced by: patient demonstrates decreased symptoms of depression and reports a Depression rating of 3 or less  01/08/16: Patient stable for discharge per MD.  3.  Goal(s): Patient will demonstrate decreased signs of withdrawal due to substance abuse   Met:  Yes  Target date: by discharge  As evidenced by: Patient will produce a CIWA/COWS score of 0, have stable vitals signs, and no symptoms of withdrawal 01/08/16: Patient stable for discharge per MD.     Attendees: Physician:  Merlyn Albert, MD 3/27/201711:11 AM  Nursing:   Nicanor Bake, RN 3/27/201711:11 AM  Other:  Carmell Austria, [place] 3/27/201711:11 AM  Other:   3/27/201711:11 AM  Other:   3/27/201711:11 AM  Other:  3/27/201711:11 AM  Other:  3/27/201711:11 AM  Other:  3/27/201711:11 AM  Other:  3/27/201711:11 AM  Other:  3/27/201711:11 AM  Other:  3/27/201711:11 AM  Other:   3/27/201711:11 AM   Scribe for Treatment Team:   Keene Breath, MSW, [place]  01/08/2016, 11:11 AM

## 2016-01-08 NOTE — Plan of Care (Signed)
Problem: Alteration in mood; excessive anxiety as evidenced by: Goal: STG-Pt can identify coping skills to manage panic/anxiety (Patient can identify at least ____ coping skills to manage panic/anxiety attack)  Outcome: Progressing Patient is managing his anxiety regarding his mother's heart attack well at this time

## 2016-01-08 NOTE — Discharge Summary (Addendum)
Physician Discharge Summary Note  Patient:  Brett [name] is an 35 y.o., male MRN:  [id number] DOB:  August 20, 1981 Patient phone:  [phone] (home)  Patient address:   [place] [address],  Total Time spent with patient: 30 minutes  Date of Admission:  01/05/2016 Date of Discharge: 01/08/16  Reason for Admission:  SI  Principal Problem: Bipolar 2 disorder, major depressive episode [facility]) Discharge Diagnoses: Patient Active Problem List   Diagnosis Date Noted  . Tobacco use disorder [F17.200] 01/05/2016  . Alcohol use disorder, severe, dependence ([place]) [F10.20] 01/05/2016  . Alcohol withdrawal ([place]) [F10.239] 01/05/2016  . Cannabis use disorder, moderate, dependence ([place]) [F12.20] 01/05/2016  . Bipolar 2 disorder, major depressive episode ([place]) [F31.81] 01/04/2016  . 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 Hernia, inguinal [K40.90] 12/22/2012   History of Present Illness:  Brett [name] is an 35 y.o. Male who voluntarily presented to the ED on 3/23 seeking a psychiatric evaluation due to anxiety and SI. Alcohol level was 130.  Pt states that he lives with his wife and three children. 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 Pt states that he discovered that his wife of 10 years has been cheating on him. Pt reports that he has been aware of this for approximately five months. Pt states that he was under the impression that the affair was over but found out on yesterday that his wife was still seeing someone else. Patient says this is not the first time she has had an affair. Pt. has expressed that he has been experiencing suicidal ideations with a plan to overdose for the last 2 days. Pt reports that he has 12 pills that he planned on taking them but the thought of his children prevented him for following through with this  plan.   Pt has a history of Bipolar disorder and alcohol dep. Pt reports that he hasn't eaten since 12/04/15. Pt complains of decreased in ability to obtain restful sleep and weight loss (30 within the past/x4 months). Pt. denies the presence of any auditory or visual hallucinations at this time. Pt has also shared that he has had HI towards the person that his wife has been cheating with. Pt states that he knows this individuals name, address and work place. Pt reports that he carries a baseball bat in his car with intentions for harming this individual. Pt states 'He ruined my life and I want to ruin his."   Substance abuse: h/o alcoholism. Has been admitted for detox in the past. Received intensive outpt substance abuse years ago at this hospital. Currently drinking 6 beers every night. Pt smokes marijuana every night, and has done so for >15 years. He somokes 1 pack cigarettes per day. Alcohol level at arrival at 9: 30 am was 130. Urine tox was + for cannabis.  Associated Signs/Symptoms: Depression Symptoms: depressed mood, insomnia, suicidal thoughts with specific plan, (Hypo) Manic Symptoms: Financial Extravagance, Impulsivity, Labiality of Mood, Anxiety Symptoms: Excessive Worry, Psychotic Symptoms: denies PTSD Symptoms: NA   Past Psychiatric History: has been admitted in our unit at least 3 times before. He has been diagnosed with bipolar d/o type II and alcohol dep. No history of self injury or suicidal attempts. Pt has ben treated in the past with seroquel--sedation, zyprexa and depakote but never took medications for Novella. Has no f/u with psychiatry in years.  Prior to admission he was prescribed with buspar for anxiety.    Past Medical History: denies having any chronic medical condition. F/u with [facility] for primary care. 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 Past Medical History  Diagnosis Date  . Depression    History reviewed. No  pertinent past surgical history.  Family History: mother suffers from depression. Multiple relatives with anxiety. Per records--The patient reported that his mother has history of bipolar disorder and she takes Zyprexa. All his uncles are alcoholic. He denied any history of suicide in his family.   Social History: Married twice. Has a 91 y/o child from first marriage. He divorced his first wife due to her having an extramarital affair. Married with second wife for 10 years. They have 2 kids ages 5 and 33 y/o. 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 Has been in that position for several years. Says he works about 60 h per week. Wife works as well.   Social History:  History  Alcohol Use  . Yes     History  Drug Use  . Yes  . Special: Marijuana    Social History   Social History  . Marital Status: Married    Spouse Name: N/A  . Number of Children: N/A  . Years of Education: N/A   Social History Main Topics  . Smoking status: Current Every Day Smoker -- 1.00 packs/day    Types: Cigarettes  . Smokeless tobacco: None  . Alcohol Use: Yes  . Drug Use: Yes    Special: Marijuana  . Sexual Activity: Not Asked   Other Topics Concern  . None   Social History Narrative    Hospital Course:   Bipolar d/o: unclear if bipolar type I or II.  Says he has never been admitted to the hospital with mania.  Most of his hospitalizations have been due to depression and or alcohol issues. Pt has been started on abilify 15 mg q day. Pt feels medication is working well,.  Insomnia: continue  trazodone 100 mg po qhs  Alcohol withdrawal: pt received a low dose librium taper.  Alcohol dep, cannabis dep: in need of treatment for substance abuse at discharge.  Tobacco use d/o: pt received  nicotine 21 mg q day.  Labs: will order TSH, lipid panel, HbA1c, b12 and prolactin in am tomorrow. No STD's, no HIV  Dispo: will be d/c home today  Pt was calm and pleasant during his stay. He  was compliant with treatment.  No need for seclusion, restraints or forced medications.  Good participation in programming.  No unsafe or disruptive behavior noted.  On discharge he denied SI, HI, hallucinations, problems with mood, appetite, energy, sleep or concentration.  Reports improvement in mood, no longer having SI, denies issues with attention or concentration.  No SE from medications.  No physical complaints.     Musculoskeletal: Strength & Muscle Tone: within normal limits Gait & Station: normal Patient leans: N/A  Psychiatric Specialty Exam: Review of Systems  Constitutional: Negative.   HENT: Negative.   Eyes: Negative.   Respiratory: Negative.   Cardiovascular: Negative.   Gastrointestinal: Negative.   Genitourinary: Negative.   Musculoskeletal: Negative.   Skin: Negative.   Neurological: Negative.   Endo/Heme/Allergies: Negative.   Psychiatric/Behavioral: Negative.     Blood pressure 129/82, pulse 104, temperature 97.8 F (36.6 C), temperature source Oral, resp. rate 20, height 5' 9"  (1.753 m), SpO2 99 %.There is no weight  on file to calculate BMI.  General Appearance: Well Groomed  Engineer, water::  Good  Speech:  Clear and Coherent  Volume:  Normal  Mood:  Euthymic  Affect:  Appropriate  Thought Process:  Linear  Orientation:  Full (Time, Place, and Person)  Thought Content:  Hallucinations: None  Suicidal Thoughts:  No  Homicidal Thoughts:  No  Memory:  Immediate;   Good Recent;   Good Remote;   Good  Judgement:  Good  Insight:  Good  Psychomotor Activity:  Normal  Concentration:  Good  Recall:  Good  Fund of Knowledge:Good  Language: Good  Akathisia:  No  Handed:    AIMS (if indicated):     Assets:  Communication Skills Housing Physical Health Social Support  ADL's:  Intact  Cognition: WNL  Sleep:  Number of Hours: 7   Have you used any form of tobacco in the last 30 days? (Cigarettes, Smokeless Tobacco, Cigars, and/or Pipes): Yes  Has  this patient used any form of tobacco in the last 30 days? (Cigarettes, Smokeless Tobacco, Cigars, and/or Pipes) Yes, Yes, A prescription for an FDA-approved tobacco cessation medication was offered at discharge and the patient refused  Blood Alcohol level:  Lab Results  Component Value Date   [facility] 139* 93/23/5573    Metabolic Disorder Labs:  Lab Results  Component Value Date   HGBA1C 4.9 01/06/2016   Lab Results  Component Value Date   PROLACTIN 10.7 01/06/2016   Lab Results  Component Value Date   CHOL 151 01/06/2016   TRIG 131 01/06/2016   HDL 46 01/06/2016   CHOLHDL 3.3 01/06/2016   VLDL 26 01/06/2016   LDLCALC 79 01/06/2016   Results for Nienaber, WACO FOERSTER (MRN [id number]) as of 01/08/2016 20:13  Ref. Range 01/04/2016 09:53 01/04/2016 14:00 01/05/2016 18:53 01/06/2016 07:26  Sodium Latest Ref Range: 135-145 mmol/L 136     Potassium Latest Ref Range: 3.5-5.1 mmol/L 3.8     Chloride Latest Ref Range: 101-111 mmol/L 104     CO2 Latest Ref Range: 22-32 mmol/L 18 (L)     BUN Latest Ref Range: 6-20 mg/dL 12     Creatinine Latest Ref Range: 0.61-1.24 mg/dL 1.15     Calcium Latest Ref Range: 8.9-10.3 mg/dL 9.0     EGFR (Non-African Amer.) Latest Ref Range: >60 mL/min >60     EGFR (African American) Latest Ref Range: >60 mL/min >60     Glucose Latest Ref Range: 65-99 mg/dL 66     Anion gap Latest Ref Range: 5-15  14     Alkaline Phosphatase Latest Ref Range: 38-126 U/L 72     Albumin Latest Ref Range: 3.5-5.0 g/dL 4.8     AST Latest Ref Range: 15-41 U/L 28     ALT Latest Ref Range: 17-63 U/L 18     Total Protein Latest Ref Range: 6.5-8.1 g/dL 8.1     Total Bilirubin Latest Ref Range: 0.3-1.2 mg/dL 1.3 (H)     Cholesterol Latest Ref Range: 0-200 mg/dL    151  Triglycerides Latest Ref Range: <150 mg/dL    131  HDL Cholesterol Latest Ref Range: >40 mg/dL    46  LDL (calc) Latest Ref Range: 0-99 mg/dL    79  VLDL Latest Ref Range: 0-40 mg/dL    26  Total CHOL/HDL Ratio Latest  Units: RATIO    3.3  WBC Latest Ref Range: 3.8-10.6 K/uL 14.4 (H)     RBC Latest Ref Range: 4.40-5.90 MIL/uL 5.51  Hemoglobin Latest Ref Range: 13.0-18.0 g/dL 18.5 (H)     HCT Latest Ref Range: 40.0-52.0 % 52.6 (H)     MCV Latest Ref Range: 80.0-100.0 fL 95.5     MCH Latest Ref Range: 26.0-34.0 pg 33.5     MCHC Latest Ref Range: 32.0-36.0 g/dL 35.1     RDW Latest Ref Range: 11.5-14.5 % 12.8     Platelets Latest Ref Range: 150-440 K/uL 180     Acetaminophen (Tylenol), S Latest Ref Range: 10-30 ug/mL <59 (L)     Salicylate Lvl Latest Ref Range: 2.8-30.0 mg/dL <4.0     Prolactin Latest Ref Range: 4.0-15.2 ng/mL    10.7  Hemoglobin A1C Latest Ref Range: 4.0-6.0 %    4.9  TSH Latest Ref Range: 0.350-4.500 uIU/mL    1.868  Specimen source GC/Chlam Unknown  URINE, RANDOM    Chlamydia Tr Latest Ref Range: NOT DETECTED   NOT DETECTED    N gonorrhoeae Latest Ref Range: NOT DETECTED   NOT DETECTED    HIV 1/2 Antibodies Latest Ref Range: NON REACTIVE  NON REACTIVE     Interpretation (HIV Ag Ab) Unknown A non reactive te...     HIV-1 P24 Antigen - HIV24 Latest Ref Range: NON REACTIVE  NON REACTIVE     Alcohol, Ethyl (B) Latest Ref Range: <5 mg/dL 139 (H)     Amphetamines, Ur Screen Latest Ref Range: NONE DETECTED   NONE DETECTED NONE DETECTED   Barbiturates, Ur Screen Latest Ref Range: NONE DETECTED   NONE DETECTED NONE DETECTED   Benzodiazepine, Ur Scrn Latest Ref Range: NONE DETECTED   NONE DETECTED POSITIVE (A)   Cocaine Metabolite,Ur [place] Latest Ref Range: NONE DETECTED   NONE DETECTED NONE DETECTED   Methadone Scn, Ur Latest Ref Range: NONE DETECTED   NONE DETECTED NONE DETECTED   MDMA (Ecstasy)Ur Screen Latest Ref Range: NONE DETECTED   NONE DETECTED NONE DETECTED   Cannabinoid 50 Ng, Ur [place] Latest Ref Range: NONE DETECTED   POSITIVE (A) POSITIVE (A)   Opiate, Ur Screen Latest Ref Range: NONE DETECTED   NONE DETECTED NONE DETECTED   Phencyclidine (PCP) Ur S Latest Ref Range: NONE DETECTED    NONE DETECTED NONE DETECTED   Tricyclic, Ur Screen Latest Ref Range: NONE DETECTED   NONE DETECTED NONE DETECTED    See Psychiatric Specialty Exam and Suicide Risk Assessment completed by Attending Physician prior to discharge.  Discharge destination:  Home  Is patient on multiple antipsychotic therapies at discharge:  No   Has Patient had three or more failed trials of antipsychotic monotherapy by history:  No  Recommended Plan for Multiple Antipsychotic Therapies: NA     Medication List    STOP taking these medications        busPIRone 5 MG tablet  Commonly known as:  BUSPAR     diclofenac 75 MG EC tablet  Commonly known as:  VOLTAREN      TAKE these medications      Indication   ARIPiprazole 15 MG tablet  Commonly known as:  ABILIFY  Take 1 tablet (15 mg total) by mouth daily.  Start taking on:  01/09/2016      loratadine 10 MG tablet  Commonly known as:  CLARITIN  Take 10 mg by mouth daily as needed for allergies.      traZODone 100 MG tablet  Commonly known as:  DESYREL  Take 1 tablet (100 mg total) by mouth at bedtime.  Follow-up Information    Follow up with SANTAYANA, GLORIA PATRICIA, DO. Schedule an appointment as soon as possible for a visit in 2 weeks.   Specialty:  Family Medicine   Contact information:   [place] [address] [phone]       Follow up with [facility]. Go on 01/12/2016.   Why:  For follow-up care appt on Friday 01/12/16 at 7:00am   Contact information:   [place], [place] Ph [phone] Fax [phone] Lanae Boast [phone]        Signed: Hildred Priest, MD 01/08/2016, 8:24 PM

## 2016-01-08 NOTE — Progress Notes (Addendum)
  [facility] Discharge Plan :  Will you be returning to the same living situation after discharge:  Yes,  home with family At discharge, do you have transportation home?: Yes,  patient's car is in the hospital parking lot Do you have the ability to pay for your medications: No. Referred to [facility] [phone]  Release of information consent forms completed and in the chart;  Patient's signature needed at discharge.  Patient to Follow up at: Follow-up Information    Follow up with SANTAYANA, GLORIA PATRICIA, DO. Schedule an appointment as soon as possible for a visit in 2 weeks.   Specialty:  Family Medicine   Contact information:   [address] [place] [id number] [phone]       Follow up with [facility]. Go on 01/12/2016.   Why:  For follow-up care appt on Friday 01/12/16 at 7:00am   Contact information:   [address] [place] Ph [phone] Fax [phone] Brett [name] [phone]      Next level of care provider [facility] Link:no  Safety Planning and Suicide Prevention discussed: Yes,  SPE discussed with patient and Brett HoseBrittany [name] (sister) [phone]   Have you used any form of tobacco in the last 30 days? (Cigarettes, Smokeless Tobacco, Cigars, and/or Pipes): Yes  Has patient been referred to the Quitline?: Patient refused referral  Patient has been referred for addiction treatment: Yes, [facility] for intensive outpatient treatment  Brett [name], Brett [name], Brett [name] 01/08/2016, 11:12 AM [phone]

## 2016-11-26 ENCOUNTER — Emergency Department
Admission: EM | Admit: 2016-11-26 | Discharge: 2016-11-26 | Disposition: A | Discharge status: Home / Self Care | Payer: Self-pay | Attending: Emergency Medicine | Admitting: Emergency Medicine

## 2016-11-26 ENCOUNTER — Encounter: Payer: Self-pay | Admitting: Emergency Medicine

## 2016-11-26 DIAGNOSIS — F17210 Nicotine dependence, cigarettes, uncomplicated: Secondary | ICD-10-CM | POA: Insufficient documentation

## 2016-11-26 DIAGNOSIS — R6889 Other general symptoms and signs: Secondary | ICD-10-CM

## 2016-11-26 DIAGNOSIS — M546 Pain in thoracic spine: Secondary | ICD-10-CM | POA: Insufficient documentation

## 2016-11-26 DIAGNOSIS — M549 Dorsalgia, unspecified: Secondary | ICD-10-CM

## 2016-11-26 MED ORDER — HYDROCODONE-ACETAMINOPHEN 5-325 MG PO TABS: 1.0000 | ORAL_TABLET | Freq: Four times a day (QID) | ORAL | 0 refills | Status: DC | PRN

## 2016-11-26 MED ORDER — METHOCARBAMOL 500 MG PO TABS: 500.0000 mg | ORAL_TABLET | Freq: Four times a day (QID) | ORAL | 0 refills | Status: DC

## 2016-11-26 MED ORDER — OSELTAMIVIR PHOSPHATE 75 MG PO CAPS: 75.0000 mg | ORAL_CAPSULE | Freq: Two times a day (BID) | ORAL | 0 refills | Status: AC

## 2016-11-26 MED ORDER — ETODOLAC 400 MG PO TABS: 400.0000 mg | ORAL_TABLET | Freq: Two times a day (BID) | ORAL | 0 refills | Status: DC

## 2016-11-26 NOTE — Discharge Instructions (Signed)
[facility] or [facility] if any continued problems. Moist heat or ice to your muscles as needed for pain. Begin taking Tamiflu for flu symptoms. Robaxin 4 times a day for muscle spasms, etodolac 400 mg twice a day with food for inflammation and pain. Norco as needed for pain every 6 hours. You cannot drive while taking the pain medication or operate machinery. You will stay out of work if any fever or continued flu like symptoms.

## 2016-11-26 NOTE — ED Notes (Signed)
See triage note  States he developed a tight muscle under right shoulder about 1 week ago  Unsure of injury  States has been using tylenol with ice and heat  States pain is now moving from neck into lower back on same side  Also developed some joint aches this am  States his entire family has the flu   Afebrile on arrival

## 2016-11-26 NOTE — ED Provider Notes (Signed)
[facility] Emergency Department Provider Note   ____________________________________________   First MD Initiated Contact with Patient 11/26/16 1026     (approximate)  I have reviewed the triage vital signs and the nursing notes.   HISTORY  Chief Complaint Back Pain    HPI Brett [name] is a 36 y.o. male is here with complaint of right upper back pain for possibly 5 days. Patient states that he took a muscle relaxant belonging to his wife 5 days ago without any improvement. He also has the flu and he did not get a flu vaccine this year. Patient is beginning to feel somewhat achy with a slight cough. He is been unaware of any fever and denies any chills at this time. There is been no nausea or vomiting. Patient states that he has had some difficulty with his back in the past and did see the primary care for this one year ago. He states that he has now lost his insurance and has not seen them in over a year. Patient continues to work despite his pain. He states that he cannot miss work.  Currently he rates his pain as 7 out of 10. Patient did drive himself to the emergency room today.   Past Medical History:  Diagnosis Date  . Depression     Patient Active Problem List   Diagnosis Date Noted  . Tobacco use disorder 01/05/2016  . Alcohol use disorder, severe, dependence ([facility]) 01/05/2016  . Alcohol withdrawal ([facility]) 01/05/2016  . Cannabis use disorder, moderate, dependence ([facility]) 01/05/2016  . Bipolar 2 disorder, major depressive episode ([facility]) 01/04/2016  . 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 Chronic prostatitis 12/22/2012  . Hernia, inguinal 12/22/2012    History reviewed. No pertinent surgical history.  Prior to Admission medications   Medication Sig Start Date End Date Taking? Authorizing Provider  busPIRone (BUSPAR) 5 MG tablet Take 5 mg by mouth 2 (two) times daily.   Yes Historical Provider, MD  diclofenac (VOLTAREN) 75  MG EC tablet Take 75 mg by mouth 2 (two) times daily.   Yes Historical Provider, MD  etodolac (LODINE) 400 MG tablet Take 1 tablet (400 mg total) by mouth 2 (two) times daily. 11/26/16   Tommi Rumps, PA-C  HYDROcodone-acetaminophen (NORCO/VICODIN) 5-325 MG tablet Take 1 tablet by mouth every 6 (six) hours as needed for moderate pain. 11/26/16   Tommi Rumps, PA-C  methocarbamol (ROBAXIN) 500 MG tablet Take 1 tablet (500 mg total) by mouth 4 (four) times daily. 11/26/16   Tommi Rumps, PA-C  oseltamivir (TAMIFLU) 75 MG capsule Take 1 capsule (75 mg total) by mouth 2 (two) times daily. 11/26/16 12/01/16  Tommi Rumps, PA-C    Allergies Penicillins  No family history on file.  Social History Social History  Substance Use Topics  . Smoking status: Current Every Day Smoker    Packs/day: 1.00    Types: Cigarettes  . Smokeless tobacco: Never Used  . Alcohol use Yes    Review of Systems Constitutional: No fever/chills Cardiovascular: Denies chest pain. Respiratory: Denies shortness of breath. Gastrointestinal: No abdominal pain.  No nausea, no vomiting.  No diarrhea.   Genitourinary: Negative for dysuria. Musculoskeletal:  positive for right upper back pain.: Negative for rash. Neurological: Negative for headaches, focal weakness or numbness.  10-point ROS otherwise negative.  ____________________________________________   PHYSICAL EXAM:  VITAL SIGNS: ED Triage Vitals  Enc Vitals Group     BP 11/26/16 0949 Marland Kitchen)  148/87     Pulse Rate 11/26/16 1120 92     Resp 11/26/16 0949 20     Temp 11/26/16 0949 97.4 F (36.3 C)     Temp Source 11/26/16 0949 Oral     SpO2 11/26/16 0949 97 %     Weight 11/26/16 0944 190 lb (86.2 kg)     Height 11/26/16 0944 5\' 9"  (1.753 m)     Head Circumference --      Peak Flow --      Pain Score 11/26/16 0944 7     Pain Loc --      Pain Edu? --      Excl. in GC? --     Constitutional: Alert and oriented. Well appearing and in no acute  distress. Eyes: Conjunctivae are normal. PERRL. EOMI. Head: Atraumatic. Nose: Minimal congestion/rhinnorhea.  EACs and TMs are clear bilaterally. Mouth/Throat: Mucous membranes are moist.  Oropharynx non-erythematous. Neck: No stridor.   Hematological/Lymphatic/Immunilogical: No cervical lymphadenopathy. Cardiovascular: Normal rate, regular rhythm. Grossly normal heart sounds.  Good peripheral circulation. Respiratory: Normal respiratory effort.  No retractions. Lungs CTAB. Gastrointestinal: Soft and nontender. No distention.  Musculoskeletal: Examination of the right shoulder there is soft tissue tenderness to very light touch. Range of motion is without crepitus. No decreased range of motion is noted. No gross deformity. No ecchymosis or abrasions were seen. Neurologic:  Normal speech and language. No gross focal neurologic deficits are appreciated. No gait instability. Skin:  Skin is warm, dry and intact. No rash noted. Psychiatric: Mood and affect are normal. Speech and behavior are normal.  ____________________________________________   LABS (all labs ordered are listed, but only abnormal results are displayed)  Labs Reviewed - No data to display  PROCEDURES  Procedure(s) performed: None  Procedures  Critical Care performed: No  ____________________________________________   INITIAL IMPRESSION / ASSESSMENT AND PLAN / ED COURSE  Pertinent labs & imaging results that were available during my care of the patient were reviewed by me and considered in my medical decision making (see chart for details).  Patient was given a prescription for Tamiflu to begin taking today. For patient's shoulder he was given a prescription for etodolac 400 mg twice a day with food along with Norco one every 6 hours as needed for pain.  Patient was also given Robaxin 5 mg 4 times a day as needed for muscle spasms. He is to follow-up with [facility] clinic if any continued problems or Dr. Ernest Pine if not  improving.      ____________________________________________   FINAL CLINICAL IMPRESSION(S) / ED DIAGNOSES  Final diagnoses:  Upper back pain on right side  Influenza-like symptoms      NEW MEDICATIONS STARTED DURING THIS VISIT:  Discharge Medication List as of 11/26/2016 11:17 AM    START taking these medications   Details  etodolac (LODINE) 400 MG tablet Take 1 tablet (400 mg total) by mouth 2 (two) times daily., Starting Tue 11/26/2016, Print    HYDROcodone-acetaminophen (NORCO/VICODIN) 5-325 MG tablet Take 1 tablet by mouth every 6 (six) hours as needed for moderate pain., Starting Tue 11/26/2016, Print    methocarbamol (ROBAXIN) 500 MG tablet Take 1 tablet (500 mg total) by mouth 4 (four) times daily., Starting Tue 11/26/2016, Print    oseltamivir (TAMIFLU) 75 MG capsule Take 1 capsule (75 mg total) by mouth 2 (two) times daily., Starting Tue 11/26/2016, Until Sun 12/01/2016, Print         Note:  This document was prepared using Dragon  voice recognition software and may include unintentional dictation errors.    Tommi Rumpshonda L Summers, PA-C 11/26/16 1607    Jeanmarie PlantJames A McShane, MD 11/27/16 [phone]

## 2016-11-26 NOTE — ED Triage Notes (Signed)
C/O right upper back, knot x 5 days.  Pain not improving.  Also , multiple family members have flu and patient starting to feel "body aches today".

## 2017-01-03 DIAGNOSIS — F419 Anxiety disorder, unspecified: Secondary | ICD-10-CM | POA: Insufficient documentation

## 2017-08-05 ENCOUNTER — Encounter: Payer: Self-pay | Admitting: *Deleted

## 2017-08-05 ENCOUNTER — Emergency Department
Admission: EM | Admit: 2017-08-05 | Discharge: 2017-08-05 | Disposition: A | Discharge status: Home / Self Care | Payer: Self-pay | Attending: Emergency Medicine | Admitting: Emergency Medicine

## 2017-08-05 DIAGNOSIS — J0100 Acute maxillary sinusitis, unspecified: Secondary | ICD-10-CM | POA: Insufficient documentation

## 2017-08-05 DIAGNOSIS — Z79899 Other long term (current) drug therapy: Secondary | ICD-10-CM | POA: Insufficient documentation

## 2017-08-05 DIAGNOSIS — F17210 Nicotine dependence, cigarettes, uncomplicated: Secondary | ICD-10-CM | POA: Insufficient documentation

## 2017-08-05 MED ORDER — METHYLPREDNISOLONE SODIUM SUCC 125 MG IJ SOLR
125.0000 mg | Freq: Once | INTRAMUSCULAR | Status: AC
  Administered 2017-08-05: 125 mg via INTRAMUSCULAR
  Filled 2017-08-05: qty 2

## 2017-08-05 MED ORDER — PREDNISONE 10 MG PO TABS: ORAL_TABLET | ORAL | 0 refills | Status: DC

## 2017-08-05 MED ORDER — DOXYCYCLINE HYCLATE 50 MG PO CAPS: 100.0000 mg | ORAL_CAPSULE | Freq: Two times a day (BID) | ORAL | 0 refills | Status: AC

## 2017-08-05 NOTE — ED Triage Notes (Signed)
Pt complains of congestion, sore throat and cough for two days, pt reports drainage is green in color, pt denies fever

## 2017-08-05 NOTE — ED Notes (Signed)
Patient states he thinks he has sinus infection, ongoing for two days, minor headache and pressure feeling in forehead.

## 2017-08-05 NOTE — ED Notes (Signed)
Patient has no signs or symptoms of adverse reaction to medication given. Patient does not appear to be in any acute distress at time of discharge. Patient ambulatory to lobby with steady gate. Patient denies any comments or concerns regarding discharge.

## 2017-08-05 NOTE — ED Provider Notes (Signed)
[facility] Emergency Department Provider Note  ____________________________________________  Time seen: Approximately 8:02 AM  I have reviewed the triage vital signs and the nursing notes.   HISTORY  Chief Complaint Nasal Congestion and Sore Throat    HPI Brett [name] is a 36 y.o. male that presents to the emergency department for evaluation of nasal congestion for 2 days. Patient states that he has postnasal drip, which is causing him to cough.He has seasonal allergies. He smokes 10 cigarettes per day. No asthma. His mother was just discharged from the hospital with pneumonia so he is not "fooling around with this." He tried prescription strength Mucinex, which did not help. No fever, shortness of breath, chest pain, nausea, vomiting, abdominal pain.   Past Medical History:  Diagnosis Date  . Depression     Patient Active Problem List   Diagnosis Date Noted  . Tobacco use disorder 01/05/2016  . Alcohol use disorder, severe, dependence (HCC) 01/05/2016  . Alcohol withdrawal (HCC) 01/05/2016  . Cannabis use disorder, moderate, dependence (HCC) 01/05/2016  . Bipolar 2 disorder, major depressive episode (HCC) 01/04/2016  . 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 Chronic prostatitis 12/22/2012  . Hernia, inguinal 12/22/2012    History reviewed. No pertinent surgical history.  Prior to Admission medications   Medication Sig Start Date End Date Taking? Authorizing Provider  busPIRone (BUSPAR) 5 MG tablet Take 5 mg by mouth 2 (two) times daily.    [provider]  diclofenac (VOLTAREN) 75 MG EC tablet Take 75 mg by mouth 2 (two) times daily.    [provider]  doxycycline (VIBRAMYCIN) 50 MG capsule Take 2 capsules (100 mg total) by mouth 2 (two) times daily. 08/05/17 08/15/17  Enid Derry, PA-C  etodolac (LODINE) 400 MG tablet Take 1 tablet (400 mg total) by mouth 2 (two) times daily. 11/26/16   Tommi Rumps,  PA-C  HYDROcodone-acetaminophen (NORCO/VICODIN) 5-325 MG tablet Take 1 tablet by mouth every 6 (six) hours as needed for moderate pain. 11/26/16   Tommi Rumps, PA-C  methocarbamol (ROBAXIN) 500 MG tablet Take 1 tablet (500 mg total) by mouth 4 (four) times daily. 11/26/16   Tommi Rumps, PA-C  predniSONE (DELTASONE) 10 MG tablet Take 6 tablets on day 1, take 5 tablets on day 2, take 4 tablets on day 3, take 3 tablets on day 4, take 2 tablets on day 5, take 1 tablet on day 6 08/05/17   Enid Derry, PA-C    Allergies Penicillins  No family history on file.  Social History Social History  Substance Use Topics  . Smoking status: Current Every Day Smoker    Packs/day: 1.00    Types: Cigarettes  . Smokeless tobacco: Never Used  . Alcohol use Yes     Review of Systems  Constitutional: No fever/chills Eyes: No visual changes. No discharge. ENT: Positive for congestion and rhinorrhea. Cardiovascular: No chest pain. Respiratory: Positive for cough. No SOB. Gastrointestinal: No abdominal pain.  No nausea, no vomiting.  No diarrhea.  No constipation. Musculoskeletal: Negative for musculoskeletal pain. Skin: Negative for rash, abrasions, lacerations, ecchymosis.    ____________________________________________   PHYSICAL EXAM:  VITAL SIGNS: ED Triage Vitals  Enc Vitals Group     BP 08/05/17 0739 (!) 141/93     Pulse Rate 08/05/17 0739 100     Resp 08/05/17 0739 18     Temp 08/05/17 0739 98.7 F (37.1 C)     Temp Source 08/05/17 0739 Oral  SpO2 08/05/17 0739 98 %     Weight 08/05/17 0747 185 lb (83.9 kg)     Height 08/05/17 0747 5\' 10"  (1.778 m)     Head Circumference --      Peak Flow --      Pain Score 08/05/17 0742 3     Pain Loc --      Pain Edu? --      Excl. in GC? --      Constitutional: Alert and oriented. Well appearing and in no acute distress. Eyes: Conjunctivae are normal. PERRL. EOMI. No discharge. Head: Atraumatic. ENT: Positive for frontal  and maxillary sinus tenderness.      Ears: Tympanic membranes pearly gray with good landmarks. No discharge.      Nose: Mild congestion/rhinnorhea.      Mouth/Throat: Mucous membranes are moist. Oropharynx non-erythematous. Tonsils not enlarged. No exudates. Uvula midline. Neck: No stridor.   Hematological/Lymphatic/Immunilogical: No cervical lymphadenopathy. Cardiovascular: Normal rate, regular rhythm.  Good peripheral circulation. Respiratory: Normal respiratory effort without tachypnea or retractions. Lungs CTAB. Good air entry to the bases with no decreased or absent breath sounds. Gastrointestinal: Bowel sounds 4 quadrants. Soft and nontender to palpation. No guarding or rigidity. No palpable masses. No distention. Musculoskeletal: Full range of motion to all extremities. No gross deformities appreciated. Neurologic:  Normal speech and language. No gross focal neurologic deficits are appreciated.  Skin:  Skin is warm, dry and intact. No rash noted.    ____________________________________________   LABS (all labs ordered are listed, but only abnormal results are displayed)  Labs Reviewed - No data to display ____________________________________________  EKG   ____________________________________________  RADIOLOGY   No results found.  ____________________________________________    PROCEDURES  Procedure(s) performed:    Procedures    Medications  methylPREDNISolone sodium succinate (SOLU-MEDROL) 125 mg/2 mL injection 125 mg (125 mg Intramuscular Given 08/05/17 0802)     ____________________________________________   INITIAL IMPRESSION / ASSESSMENT AND PLAN / ED COURSE  Pertinent labs & imaging results that were available during my care of the patient were reviewed by me and considered in my medical decision making (see chart for details).  Review of the [place] CSRS was performed in accordance of the [facility] prior to dispensing any controlled  drugs.  Patient's diagnosis is consistent with sinusitis. Vital signs and exam are reassuring. This is likely viral. Patient was educated about viral versus bacterial illnesses. He was educated that antibiotics will likely not help at this time but I will write a prescription that he can take at the end of the week if symptoms have not improved. Education about antibiotic resistance was provided. Patient appears well and is staying well hydrated.  Patient feels comfortable going home. He was given a dose of IM Solu-Medrol. Patient will be discharged home with prescriptions for prednisone and doxycycline. Patient is to follow up with PCP as needed or otherwise directed. Patient is given ED precautions to return to the ED for any worsening or new symptoms.     ____________________________________________  FINAL CLINICAL IMPRESSION(S) / ED DIAGNOSES  Final diagnoses:  Acute non-recurrent maxillary sinusitis      NEW MEDICATIONS STARTED DURING THIS VISIT:  Discharge Medication List as of 08/05/2017  8:00 AM    START taking these medications   Details  doxycycline (VIBRAMYCIN) 50 MG capsule Take 2 capsules (100 mg total) by mouth 2 (two) times daily., Starting Tue 08/05/2017, Until Fri 08/15/2017, Print    predniSONE (DELTASONE) 10 MG tablet Take 6  tablets on day 1, take 5 tablets on day 2, take 4 tablets on day 3, take 3 tablets on day 4, take 2 tablets on day 5, take 1 tablet on day 6, Print            This chart was dictated using voice recognition software/Dragon. Despite best efforts to proofread, errors can occur which can change the meaning. Any change was purely unintentional.    Enid DerryWagner, Lennan Malone, PA-C 08/05/17 [id number]    Sharman CheekStafford, Phillip, MD 08/07/17 [phone]

## 2017-08-05 NOTE — Discharge Instructions (Signed)
Do not begin antibiotics until the end of the week unless symptoms worsen or do not improve with steroids.

## 2017-12-05 ENCOUNTER — Encounter: Payer: Self-pay | Admitting: Emergency Medicine

## 2017-12-05 ENCOUNTER — Emergency Department
Admission: EM | Admit: 2017-12-05 | Discharge: 2017-12-05 | Disposition: A | Discharge status: Home / Self Care | Payer: Self-pay | Attending: Emergency Medicine | Admitting: Emergency Medicine

## 2017-12-05 DIAGNOSIS — R030 Elevated blood-pressure reading, without diagnosis of hypertension: Secondary | ICD-10-CM | POA: Insufficient documentation

## 2017-12-05 DIAGNOSIS — F17210 Nicotine dependence, cigarettes, uncomplicated: Secondary | ICD-10-CM | POA: Insufficient documentation

## 2017-12-05 DIAGNOSIS — R0981 Nasal congestion: Secondary | ICD-10-CM | POA: Insufficient documentation

## 2017-12-05 DIAGNOSIS — J029 Acute pharyngitis, unspecified: Secondary | ICD-10-CM | POA: Insufficient documentation

## 2017-12-05 DIAGNOSIS — Z79899 Other long term (current) drug therapy: Secondary | ICD-10-CM | POA: Insufficient documentation

## 2017-12-05 DIAGNOSIS — B349 Viral infection, unspecified: Secondary | ICD-10-CM | POA: Insufficient documentation

## 2017-12-05 LAB — GROUP A STREP BY PCR: GROUP A STREP BY PCR: NOT DETECTED

## 2017-12-05 MED ORDER — GUAIFENESIN-CODEINE 100-10 MG/5ML PO SOLN: 5.0000 mL | ORAL | 0 refills | Status: DC | PRN

## 2017-12-05 NOTE — ED Provider Notes (Signed)
[facility] Emergency Department Provider Note  ____________________________________________   First MD Initiated Contact with Patient 12/05/17 [phone]     (approximate)  I have reviewed the triage vital signs and the nursing notes.   HISTORY  Chief Complaint Cough and Nasal Brett [name]  HPI Ok EdwardsRobert Nicholas Baugher is a 37 y.o. male is here with complaint of sore [name], Brett [name], and cough.  Patient has been taking over-the-counter medication for his symptoms without any relief.  He states he also gets frequent sinus infections.  He denies any body aches or symptoms of the flu.  He also is unaware of any hypertension.  He is not sure if the over-the-counter medication contains pseudoephedrine.  There is a positive family history of hypertension.  Past Medical History:  Diagnosis Date  . Depression     Patient Active Problem List   Diagnosis Date Noted  . Tobacco use disorder 01/05/2016  . Alcohol use disorder, severe, dependence (HCC) 01/05/2016  . Alcohol withdrawal (HCC) 01/05/2016  . Cannabis use disorder, moderate, dependence (HCC) 01/05/2016  . Bipolar 2 disorder, major depressive episode (HCC) 01/04/2016  . 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 Chronic prostatitis 12/22/2012  . Hernia, inguinal 12/22/2012    History reviewed. No pertinent surgical history.  Prior to Admission medications   Medication Sig Start Date End Date Taking? Authorizing Provider  busPIRone (BUSPAR) 5 MG tablet Take 5 mg by mouth 2 (two) times daily.    [provider]  diclofenac (VOLTAREN) 75 MG EC tablet Take 75 mg by mouth 2 (two) times daily.    [provider]  guaiFENesin-codeine 100-10 MG/5ML syrup Take 5 mLs by mouth every 4 (four) hours as needed. 12/05/17   Tommi RumpsSummers, Falon Huesca L, PA-C    Allergies Penicillins  No family history on file.  Social History Social History   Tobacco Use  . Smoking status: Current Every Day Smoker     Packs/day: 1.00    Types: Cigarettes  . Smokeless tobacco: Never Used  Substance Use Topics  . Alcohol use: Yes  . Drug use: Yes    Review of Systems Constitutional: No fever/chills Eyes: No visual changes. ENT: Positive sore [name].  Positive nasal Brett [name]. Cardiovascular: Denies chest pain. Respiratory: Denies shortness of breath.  Positive cough. Gastrointestinal: No abdominal pain.  No nausea, no vomiting.  Musculoskeletal: Negative for back pain. Skin: Negative for rash. Neurological: Negative for headaches, focal weakness or numbness. ___________________________________________   PHYSICAL EXAM:  VITAL SIGNS: ED Triage Vitals  Enc Vitals Group     BP 12/05/17 0822 (!) 159/103     Pulse Rate 12/05/17 0822 90     Resp 12/05/17 0822 18     Temp 12/05/17 0822 97.7 F (36.5 C)     Temp Source 12/05/17 0822 Oral     SpO2 12/05/17 0822 95 %     Weight 12/05/17 0821 190 lb (86.2 kg)     Height 12/05/17 0821 5\' 10"  (1.778 m)     Head Circumference --      Peak Flow --      Pain Score 12/05/17 0821 3     Pain Loc --      Pain Edu? --      Excl. in GC? --    Constitutional: Alert and oriented. Well appearing and in no acute distress. Eyes: Conjunctivae are normal. Head: Atraumatic. Nose: Positive Brett [name]/rhinnorhea.  TMs are dull with poor light reflex.  No injection or erythema present. Mouth/[name]: Mucous  membranes are moist.  Oropharynx non-erythematous. Neck: No stridor.   Hematological/Lymphatic/Immunilogical: No cervical lymphadenopathy. Cardiovascular: Normal rate, regular rhythm. Grossly normal heart sounds.  Good peripheral circulation. Respiratory: Normal respiratory effort.  No retractions. Lungs CTAB. Musculoskeletal: Moves upper and lower extremities without any difficulty.  Normal gait was noted. Neurologic:  Normal speech and language. No gross focal neurologic deficits are appreciated. No gait instability. Skin:  Skin is warm, dry and intact.  No rash noted. Psychiatric: Mood and affect are normal. Speech and behavior are normal. ___________________________________________   LABS (all labs ordered are listed, but only abnormal results are displayed)  Labs Reviewed  GROUP A STREP BY PCR   ____________________________________________  PROCEDURES  Procedure(s) performed: None  Procedures  Critical Care performed: No  ____________________________________________   INITIAL IMPRESSION / ASSESSMENT AND PLAN / ED COURSE Patient strep test is negative.  He was treated for viral upper respiratory infection.  He is to discontinue taking any medication he has at home that might have pseudoephedrine in it.  He is to follow-up with [facility] acute care for recheck of his blood pressure is elevated in the department today.  Patient was discharged with a prescription for Robitussin-AC as needed for cough.  He was made aware that this medication contains a narcotic and could cause drowsiness.  ____________________________________________   FINAL CLINICAL IMPRESSION(S) / ED DIAGNOSES  Final diagnoses:  Viral illness  Elevated blood pressure reading     ED Discharge Orders        Ordered    guaiFENesin-codeine 100-10 MG/5ML syrup  Every 4 hours PRN     12/05/17 1110       Note:  This document was prepared using Dragon voice recognition software and may include unintentional dictation errors.    Tommi Rumps, PA-C 12/05/17 1217    Sharyn Creamer, MD 12/05/17 1626

## 2017-12-05 NOTE — Discharge Instructions (Signed)
Follow-up with your primary care provider or [facility] if any continued problems.  Also have your blood pressure rechecked as it was still elevated at the time of your discharge. Take Robitussin-AC as needed for cough mostly at night as it contains a narcotic which may cause drowsiness.  Take over-the-counter medication for drainage however do not take any medications containing pseudoephedrine.

## 2017-12-05 NOTE — ED Triage Notes (Signed)
Pt comes into the ED via POV c/o cough, congestion, drainage down his throat and the start of ear pain. Patient states he has h/o sinus infections and this feels similar.  Patient wanted to make sure he could catch the symptoms early and try to have relief before returning to work later this week.  PCP unable to see patient soon.

## 2017-12-05 NOTE — ED Notes (Signed)
Patient c/o cough, congestion, ear pain, and drainage. A&O x4

## 2018-02-03 ENCOUNTER — Other Ambulatory Visit: Payer: Self-pay

## 2018-02-03 ENCOUNTER — Encounter: Payer: Self-pay | Admitting: Emergency Medicine

## 2018-02-03 ENCOUNTER — Emergency Department
Admission: EM | Admit: 2018-02-03 | Discharge: 2018-02-03 | Disposition: A | Discharge status: Home / Self Care | Payer: Self-pay | Attending: Emergency Medicine | Admitting: Emergency Medicine

## 2018-02-03 DIAGNOSIS — R1012 Left upper quadrant pain: Secondary | ICD-10-CM | POA: Insufficient documentation

## 2018-02-03 DIAGNOSIS — Z79899 Other long term (current) drug therapy: Secondary | ICD-10-CM | POA: Insufficient documentation

## 2018-02-03 DIAGNOSIS — B349 Viral infection, unspecified: Secondary | ICD-10-CM | POA: Insufficient documentation

## 2018-02-03 DIAGNOSIS — F17210 Nicotine dependence, cigarettes, uncomplicated: Secondary | ICD-10-CM | POA: Insufficient documentation

## 2018-02-03 DIAGNOSIS — R509 Fever, unspecified: Secondary | ICD-10-CM | POA: Insufficient documentation

## 2018-02-03 LAB — COMPREHENSIVE METABOLIC PANEL
ALK PHOS: 62 U/L (ref 38–126)
ALT: 19 U/L (ref 17–63)
ANION GAP: 6 (ref 5–15)
AST: 24 U/L (ref 15–41)
Albumin: 4.1 g/dL (ref 3.5–5.0)
BUN: 10 mg/dL (ref 6–20)
CALCIUM: 8.7 mg/dL — AB (ref 8.9–10.3)
CHLORIDE: 104 mmol/L (ref 101–111)
CO2: 26 mmol/L (ref 22–32)
CREATININE: 0.99 mg/dL (ref 0.61–1.24)
GFR calc non Af Amer: >60 mL/min (ref >60)
Glucose, Bld: 135 mg/dL — ABNORMAL HIGH (ref 65–99)
Potassium: 4 mmol/L (ref 3.5–5.1)
SODIUM: 136 mmol/L (ref 135–145)
Total Bilirubin: 1 mg/dL (ref 0.3–1.2)
Total Protein: 6.9 g/dL (ref 6.5–8.1)

## 2018-02-03 LAB — CBC
HCT: 46.7 % (ref 40.0–52.0)
Hemoglobin: 16.5 g/dL (ref 13.0–18.0)
MCH: 33.5 pg (ref 26.0–34.0)
MCHC: 35.4 g/dL (ref 32.0–36.0)
MCV: 94.8 fL (ref 80.0–100.0)
PLATELETS: 173 10*3/uL (ref 150–440)
RBC: 4.93 MIL/uL (ref 4.40–5.90)
RDW: 13.1 % (ref 11.5–14.5)
WBC: 9.6 10*3/uL (ref 3.8–10.6)

## 2018-02-03 LAB — INFLUENZA PANEL BY PCR (TYPE A & B)
INFLAPCR: NEGATIVE
Influenza B By PCR: NEGATIVE

## 2018-02-03 LAB — CK: CK TOTAL: 152 U/L (ref 49–397)

## 2018-02-03 LAB — LIPASE, BLOOD: LIPASE: 23 U/L (ref 11–51)

## 2018-02-03 MED ORDER — IBUPROFEN 800 MG PO TABS: 800.0000 mg | ORAL_TABLET | Freq: Three times a day (TID) | ORAL | 0 refills | Status: DC | PRN

## 2018-02-03 MED ORDER — IBUPROFEN 800 MG PO TABS
800.0000 mg | ORAL_TABLET | Freq: Once | ORAL | Status: AC
  Administered 2018-02-03: 800 mg via ORAL
  Filled 2018-02-03: qty 1

## 2018-02-03 NOTE — ED Provider Notes (Signed)
[facility] Emergency Department Provider Note  ____________________________________________  Time seen: Approximately 1:04 PM  I have reviewed the triage vital signs and the nursing notes.   HISTORY  Chief Complaint Influenza    HPI Brett [name] is a 37 y.o. male that presents to the emergency department for evaluation of body aches and temperature of 100 since this morning. He had some on and off LUQ discomfort earlier but none currently. He has not had an appetite today. He had 1 episode of diarrhea this morning. He has taken tylenol. He has a cousin that also has a "viral bacterial respiratory infection."  He has had the flu before and this feels the same. No sore throat, nasal congestion, cough, SOB, CP, nausea, vomiting.    Past Medical History:  Diagnosis Date  . Depression     Patient Active Problem List   Diagnosis Date Noted  . Tobacco use disorder 01/05/2016  . Alcohol use disorder, severe, dependence (HCC) 01/05/2016  . Alcohol withdrawal (HCC) 01/05/2016  . Cannabis use disorder, moderate, dependence (HCC) 01/05/2016  . Bipolar 2 disorder, major depressive episode (HCC) 01/04/2016  . 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 Chronic prostatitis 12/22/2012  . Hernia, inguinal 12/22/2012    History reviewed. No pertinent surgical history.  Prior to Admission medications   Medication Sig Start Date End Date Taking? Authorizing Provider  busPIRone (BUSPAR) 5 MG tablet Take 5 mg by mouth 2 (two) times daily.    [provider]  diclofenac (VOLTAREN) 75 MG EC tablet Take 75 mg by mouth 2 (two) times daily.    [provider]  guaiFENesin-codeine 100-10 MG/5ML syrup Take 5 mLs by mouth every 4 (four) hours as needed. 12/05/17   Tommi Rumps, PA-C  ibuprofen (ADVIL,MOTRIN) 800 MG tablet Take 1 tablet (800 mg total) by mouth every 8 (eight) hours as needed. 02/03/18   Enid Derry, PA-C     Allergies Penicillins  No family history on file.  Social History Social History   Tobacco Use  . Smoking status: Current Every Day Smoker    Packs/day: 1.00    Types: Cigarettes  . Smokeless tobacco: Never Used  Substance Use Topics  . Alcohol use: Yes  . Drug use: Yes     Review of Systems  Constitutional: Positive for fever. Eyes: No visual changes. No discharge. ENT: Negative for congestion and rhinorrhea. Cardiovascular: No chest pain. Respiratory: Negative for cough. No SOB. Gastrointestinal: No nausea, no vomiting.  No constipation. Musculoskeletal: Positive for body aches Skin: Negative for rash, abrasions, lacerations, ecchymosis. Neurological: Negative for headaches.   ____________________________________________   PHYSICAL EXAM:  VITAL SIGNS: ED Triage Vitals  Enc Vitals Group     BP 02/03/18 1227 (!) 141/91     Pulse Rate 02/03/18 1227 (!) 108     Resp 02/03/18 1227 20     Temp 02/03/18 1227 98.4 F (36.9 C)     Temp Source 02/03/18 1227 Oral     SpO2 02/03/18 1227 97 %     Weight 02/03/18 1227 190 lb (86.2 kg)     Height 02/03/18 1227 5\' 10"  (1.778 m)     Head Circumference --      Peak Flow --      Pain Score 02/03/18 1237 7     Pain Loc --      Pain Edu? --      Excl. in GC? --      Constitutional: Alert and oriented.  Well appearing and in no acute distress. Eyes: Conjunctivae are normal. PERRL. EOMI. No discharge. Head: Atraumatic. ENT: No frontal and maxillary sinus tenderness.      Ears: Tympanic membranes pearly gray with good landmarks. No discharge.      Nose: No congestion/rhinnorhea.      Mouth/Throat: Mucous membranes are moist. Oropharynx non-erythematous. Tonsils not enlarged. No exudates. Uvula midline. Neck: No stridor.   Hematological/Lymphatic/Immunilogical: No cervical lymphadenopathy. Cardiovascular: Normal rate, regular rhythm.  Good peripheral circulation. Respiratory: Normal respiratory effort without  tachypnea or retractions. Lungs CTAB. Good air entry to the bases with no decreased or absent breath sounds. Gastrointestinal: Bowel sounds 4 quadrants. Soft and nontender to palpation. No guarding or rigidity. No palpable masses. No distention. Musculoskeletal: Full range of motion to all extremities. No gross deformities appreciated. Neurologic:  Normal speech and language. No gross focal neurologic deficits are appreciated.  Skin:  Skin is warm, dry and intact. No rash noted. Psychiatric: Mood and affect are normal. Speech and behavior are normal. Patient exhibits appropriate insight and judgement.   ____________________________________________   LABS (all labs ordered are listed, but only abnormal results are displayed)  Labs Reviewed  COMPREHENSIVE METABOLIC PANEL - Abnormal; Notable for the following components:      Result Value   Glucose, Bld 135 (*)    Calcium 8.7 (*)    All other components within normal limits  INFLUENZA PANEL BY PCR (TYPE A & B)  CBC  CK  LIPASE, BLOOD   ____________________________________________  EKG   ____________________________________________  RADIOLOGY   No results found.  ____________________________________________    PROCEDURES  Procedure(s) performed:    Procedures    Medications  ibuprofen (ADVIL,MOTRIN) tablet 800 mg (800 mg Oral Given 02/03/18 1328)     ____________________________________________   INITIAL IMPRESSION / ASSESSMENT AND PLAN / ED COURSE  Pertinent labs & imaging results that were available during my care of the patient were reviewed by me and considered in my medical decision making (see chart for details).  Review of the [place] CSRS was performed in accordance of the [facility] prior to dispensing any controlled drugs.   Patient's diagnosis is consistent with viral illness. Vital signs and exam are reassuring. CBC and CMP largely normal. Lipase and CK not elevated. Influenza negative. Patient appears  well and is staying well hydrated. He is in the room drinking gatorade. Patient feels comfortable going home. Patient will be discharged home with prescriptions for ibuprofen. Patient is to follow up with PCP as needed or otherwise directed. Patient is given ED precautions to return to the ED for any worsening or new symptoms.     ____________________________________________  FINAL CLINICAL IMPRESSION(S) / ED DIAGNOSES  Final diagnoses:  Viral illness      NEW MEDICATIONS STARTED DURING THIS VISIT:  ED Discharge Orders        Ordered    ibuprofen (ADVIL,MOTRIN) 800 MG tablet  Every 8 hours PRN     02/03/18 1455          This chart was dictated using voice recognition software/Dragon. Despite best efforts to proofread, errors can occur which can change the meaning. Any change was purely unintentional.    Enid DerryWagner, Tanveer Dobberstein, PA-C 02/03/18 1536    Sharman CheekStafford, Phillip, MD 02/03/18 [phone]

## 2018-02-03 NOTE — ED Triage Notes (Signed)
Says woke up this am hurting all over and checked temp and it was 99.9.  No nausea or vomiting.

## 2018-05-23 ENCOUNTER — Emergency Department
Admission: EM | Admit: 2018-05-23 | Discharge: 2018-05-23 | Disposition: A | Discharge status: Home / Self Care | Payer: Self-pay | Attending: Emergency Medicine | Admitting: Emergency Medicine

## 2018-05-23 ENCOUNTER — Other Ambulatory Visit: Payer: Self-pay

## 2018-05-23 ENCOUNTER — Encounter: Payer: Self-pay | Admitting: Emergency Medicine

## 2018-05-23 DIAGNOSIS — J029 Acute pharyngitis, unspecified: Secondary | ICD-10-CM | POA: Insufficient documentation

## 2018-05-23 DIAGNOSIS — Z79899 Other long term (current) drug therapy: Secondary | ICD-10-CM | POA: Insufficient documentation

## 2018-05-23 DIAGNOSIS — F17210 Nicotine dependence, cigarettes, uncomplicated: Secondary | ICD-10-CM | POA: Insufficient documentation

## 2018-05-23 LAB — GROUP A STREP BY PCR: GROUP A STREP BY PCR: NOT DETECTED

## 2018-05-23 MED ORDER — LIDOCAINE VISCOUS HCL 2 % MT SOLN: 10.0000 mL | OROMUCOSAL | 0 refills | Status: DC | PRN

## 2018-05-23 MED ORDER — DEXAMETHASONE SODIUM PHOSPHATE 10 MG/ML IJ SOLN
10.0000 mg | Freq: Once | INTRAMUSCULAR | Status: AC
  Administered 2018-05-23: 10 mg via INTRAMUSCULAR
  Filled 2018-05-23: qty 1

## 2018-05-23 MED ORDER — LIDOCAINE VISCOUS HCL 2 % MT SOLN
15.0000 mL | Freq: Once | OROMUCOSAL | Status: AC
  Administered 2018-05-23: 15 mL via OROMUCOSAL
  Filled 2018-05-23: qty 15

## 2018-05-23 NOTE — ED Provider Notes (Signed)
[facility] Emergency Department Provider Note  ____________________________________________  Time seen: Approximately 8:20 AM  I have reviewed the triage vital signs and the nursing notes.   HISTORY  Chief Complaint Sore Throat    HPI Brett [name] is a 37 y.o. male that presents emergency department for evaluation of sore throat for 2 days.  Patient states that it is painful to swallow.  He has also had some nasal congestion.  No sick contacts.  No fever, chills, cough, nausea, vomiting, abdominal pain.   Past Medical History:  Diagnosis Date  . Depression     Patient Active Problem List   Diagnosis Date Noted  . Tobacco use disorder 01/05/2016  . Alcohol use disorder, severe, dependence (HCC) 01/05/2016  . Alcohol withdrawal (HCC) 01/05/2016  . Cannabis use disorder, moderate, dependence (HCC) 01/05/2016  . Bipolar 2 disorder, major depressive episode (HCC) 01/04/2016  . 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 Chronic prostatitis 12/22/2012  . Hernia, inguinal 12/22/2012    History reviewed. No pertinent surgical history.  Prior to Admission medications   Medication Sig Start Date End Date Taking? Authorizing Provider  busPIRone (BUSPAR) 5 MG tablet Take 5 mg by mouth 2 (two) times daily.    [provider]  diclofenac (VOLTAREN) 75 MG EC tablet Take 75 mg by mouth 2 (two) times daily.    [provider]  guaiFENesin-codeine 100-10 MG/5ML syrup Take 5 mLs by mouth every 4 (four) hours as needed. 12/05/17   Tommi RumpsSummers, Rhonda L, PA-C  ibuprofen (ADVIL,MOTRIN) 800 MG tablet Take 1 tablet (800 mg total) by mouth every 8 (eight) hours as needed. 02/03/18   Enid DerryWagner, Reinhold Rickey, PA-C  lidocaine (XYLOCAINE) 2 % solution Use as directed 10 mLs in the mouth or throat as needed for mouth pain. 05/23/18   Enid DerryWagner, Lezlie Ritchey, PA-C    Allergies Penicillins  No family history on file.  Social History Social History   Tobacco  Use  . Smoking status: Current Every Day Smoker    Packs/day: 0.50    Types: Cigarettes  . Smokeless tobacco: Never Used  Substance Use Topics  . Alcohol use: Yes  . Drug use: Yes     Review of Systems  Constitutional: No fever/chills Eyes: No visual changes. No discharge. ENT: Positive for congestion and rhinorrhea.  Positive for sore throat. Cardiovascular: No chest pain. Respiratory: Negative for cough. No SOB. Gastrointestinal: No abdominal pain.  No nausea, no vomiting.  Musculoskeletal: Negative for musculoskeletal pain. Skin: Negative for rash, abrasions, lacerations, ecchymosis. Neurological: Negative for headaches.   ____________________________________________   PHYSICAL EXAM:  VITAL SIGNS: ED Triage Vitals [05/23/18 0801]  Enc Vitals Group     BP (!) 132/96     Pulse Rate 98     Resp 20     Temp 97.6 F (36.4 C)     Temp Source Oral     SpO2 97 %     Weight 190 lb (86.2 kg)     Height 5\' 9"  (1.753 m)     Head Circumference      Peak Flow      Pain Score 8     Pain Loc      Pain Edu?      Excl. in GC?      Constitutional: Alert and oriented. Well appearing and in no acute distress. Eyes: Conjunctivae are normal. PERRL. EOMI. No discharge. Head: Atraumatic. ENT: No frontal and maxillary sinus tenderness.      Ears:  Tympanic membranes pearly gray with good landmarks. No discharge.      Nose: Mild congestion/rhinnorhea.      Mouth/Throat: Mucous membranes are moist. Oropharynx erythematous. Tonsils not enlarged. No exudates. Uvula midline. Neck: No stridor.   Hematological/Lymphatic/Immunilogical: No cervical lymphadenopathy. Cardiovascular: Normal rate, regular rhythm.  Good peripheral circulation. Respiratory: Normal respiratory effort without tachypnea or retractions. Lungs CTAB. Good air entry to the bases with no decreased or absent breath sounds. Gastrointestinal: Bowel sounds 4 quadrants. Soft and nontender to palpation. No guarding or  rigidity. No palpable masses. No distention. Musculoskeletal: Full range of motion to all extremities. No gross deformities appreciated. Neurologic:  Normal speech and language. No gross focal neurologic deficits are appreciated.  Skin:  Skin is warm, dry and intact. No rash noted. Psychiatric: Mood and affect are normal. Speech and behavior are normal. Patient exhibits appropriate insight and judgement.   ____________________________________________   LABS (all labs ordered are listed, but only abnormal results are displayed)  Labs Reviewed  GROUP A STREP BY PCR   ____________________________________________  EKG   ____________________________________________  RADIOLOGY   No results found.  ____________________________________________    PROCEDURES  Procedure(s) performed:    Procedures    Medications  lidocaine (XYLOCAINE) 2 % viscous mouth solution 15 mL (15 mLs Mouth/Throat Given 05/23/18 0832)  dexamethasone (DECADRON) injection 10 mg (10 mg Intramuscular Given 05/23/18 0934)     ____________________________________________   INITIAL IMPRESSION / ASSESSMENT AND PLAN / ED COURSE  Pertinent labs & imaging results that were available during my care of the patient were reviewed by me and considered in my medical decision making (see chart for details).  Review of the  [place] was performed in accordance of the [facility] prior to dispensing any controlled drugs.     Patient's diagnosis is consistent with viral pharyngitis. Vital signs and exam are reassuring. Strep is negative. Patient appears well and is staying well hydrated. Patient feels comfortable going home. Patient will be discharged home with prescriptions for viscous lidocaine. Patient is to follow up with PCP as needed or otherwise directed. Patient is given ED precautions to return to the ED for any worsening or new symptoms.     ____________________________________________  FINAL CLINICAL  IMPRESSION(S) / ED DIAGNOSES  Final diagnoses:  Viral pharyngitis      NEW MEDICATIONS STARTED DURING THIS VISIT:  ED Discharge Orders         Ordered    lidocaine (XYLOCAINE) 2 % solution  As needed     05/23/18 0925              This chart was dictated using voice recognition software/Dragon. Despite best efforts to proofread, errors can occur which can change the meaning. Any change was purely unintentional.    Enid Derry, PA-C 05/23/18 1513    Arnaldo Natal, MD 05/23/18 [phone]

## 2018-05-23 NOTE — ED Notes (Signed)
See triage note  Presents with 2 day hx of sore throat states worse this am  Feels like throat is swelling and having some discomfort with turning neck  Afebrile on arrival

## 2018-05-23 NOTE — ED Triage Notes (Signed)
Sore throat since yesterday.

## 2018-05-25 ENCOUNTER — Emergency Department
Admission: EM | Admit: 2018-05-25 | Discharge: 2018-05-25 | Disposition: A | Discharge status: Home / Self Care | Payer: Self-pay | Attending: Emergency Medicine | Admitting: Emergency Medicine

## 2018-05-25 ENCOUNTER — Encounter: Payer: Self-pay | Admitting: Emergency Medicine

## 2018-05-25 DIAGNOSIS — Z79899 Other long term (current) drug therapy: Secondary | ICD-10-CM | POA: Insufficient documentation

## 2018-05-25 DIAGNOSIS — H9202 Otalgia, left ear: Secondary | ICD-10-CM

## 2018-05-25 DIAGNOSIS — F17210 Nicotine dependence, cigarettes, uncomplicated: Secondary | ICD-10-CM | POA: Insufficient documentation

## 2018-05-25 DIAGNOSIS — H6502 Acute serous otitis media, left ear: Secondary | ICD-10-CM | POA: Insufficient documentation

## 2018-05-25 DIAGNOSIS — J069 Acute upper respiratory infection, unspecified: Secondary | ICD-10-CM | POA: Insufficient documentation

## 2018-05-25 MED ORDER — FLUTICASONE PROPIONATE 50 MCG/ACT NA SUSP: 2.0000 | Freq: Every day | NASAL | 0 refills | Status: DC

## 2018-05-25 MED ORDER — AMOXICILLIN 500 MG PO CAPS
500.0000 mg | ORAL_CAPSULE | Freq: Once | ORAL | Status: AC
  Administered 2018-05-25: 500 mg via ORAL
  Filled 2018-05-25: qty 1

## 2018-05-25 MED ORDER — PREDNISONE 10 MG (21) PO TBPK: ORAL_TABLET | ORAL | 0 refills | Status: DC

## 2018-05-25 MED ORDER — ACETAMINOPHEN-CODEINE #3 300-30 MG PO TABS: 1.0000 | ORAL_TABLET | Freq: Three times a day (TID) | ORAL | 0 refills | Status: AC | PRN

## 2018-05-25 MED ORDER — ACETAMINOPHEN-CODEINE #3 300-30 MG PO TABS: 1.0000 | ORAL_TABLET | Freq: Three times a day (TID) | ORAL | 0 refills | Status: DC | PRN

## 2018-05-25 MED ORDER — HYDROCODONE-ACETAMINOPHEN 5-325 MG PO TABS
1.0000 | ORAL_TABLET | Freq: Once | ORAL | Status: AC
  Administered 2018-05-25: 1 via ORAL
  Filled 2018-05-25: qty 1

## 2018-05-25 MED ORDER — AMOXICILLIN 500 MG PO CAPS: 500.0000 mg | ORAL_CAPSULE | Freq: Three times a day (TID) | ORAL | 0 refills | Status: DC

## 2018-05-25 MED ORDER — PREDNISONE 20 MG PO TABS
60.0000 mg | ORAL_TABLET | Freq: Once | ORAL | Status: AC
  Administered 2018-05-25: 60 mg via ORAL
  Filled 2018-05-25: qty 3

## 2018-05-25 NOTE — Discharge Instructions (Addendum)
Take the prescription meds as directed. Take OTC Claritin-D for sinus and ear pressure relief. Continue to take Tylenol for apin relief. Take the steroids as directed. Follow-up with a local clinic for continued symptoms.

## 2018-05-25 NOTE — ED Triage Notes (Signed)
Pt arrives with complaints of left ear pain. Pt recently seen and treated for upper respiratory infection. Pt reports taking tylenol sinus at 1615.

## 2018-05-25 NOTE — ED Notes (Signed)
Pt states that Saturday he came to ED thinking it was strep throat but providers told him it was just a virus. Gave him medications and told him if it got worse to come back. This morning he woke up and his throat was very sore, the left side of his face and throat began to swell, and now his ear drum feels as if it's going to "explode". He cannot hear out of the left ear and is in a lot of pain.

## 2018-05-25 NOTE — ED Provider Notes (Signed)
[name] Emergency Department Provider Note ____________________________________________  Time seen: 2035  I have reviewed the triage vital signs and the nursing notes.  HISTORY  Chief Complaint  Otalgia  HPI Brett [name] is a 37 y.o. male presents to the ED for evaluation of worsening left ear pain.  Patient was recently treated for a viral URI.  He was discharged previously after a steroid injection to take over-the-counter pain medicines.  He returns today with severely worsening left ear pain, pressure, and decreased hearing.  He also notes some serous drainage from the left ear.  He denies any barotrauma, fevers, chills, or sweats.  Also denies any chest pain, shortness of breath, nausea.  Patient reports of intermittent sore throat and pressure along the left ear 2.  He has not been taking any other medications at this time he did dose with single Tylenol allergy relief pill prior to arrival.  Denies any other sick contacts, recent travel, or other exposures.   Past Medical History:  Diagnosis Date  . Depression     Patient Active Problem List   Diagnosis Date Noted  . Tobacco use disorder 01/05/2016  . Alcohol use disorder, severe, dependence (HCC) 01/05/2016  . Alcohol withdrawal (HCC) 01/05/2016  . Cannabis use disorder, moderate, dependence (HCC) 01/05/2016  . Bipolar 2 disorder, major depressive episode (HCC) 01/04/2016  . 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 Chronic prostatitis 12/22/2012  . Hernia, inguinal 12/22/2012    History reviewed. No pertinent surgical history.  Prior to Admission medications   Medication Sig Start Date End Date Taking? Authorizing Provider  acetaminophen-codeine (TYLENOL #3) 300-30 MG tablet Take 1 tablet by mouth every 8 (eight) hours as needed for up to 2 days for moderate pain. 05/25/18 05/27/18  Aaro Meyers, Charlesetta IvoryJenise V Bacon, PA-C  amoxicillin (AMOXIL) 500 MG capsule Take 1 capsule (500 mg  total) by mouth 3 (three) times daily. 05/25/18   Sunny Gains, Charlesetta IvoryJenise V Bacon, PA-C  busPIRone (BUSPAR) 5 MG tablet Take 5 mg by mouth 2 (two) times daily.    [provider]  diclofenac (VOLTAREN) 75 MG EC tablet Take 75 mg by mouth 2 (two) times daily.    [provider]  fluticasone (FLONASE) 50 MCG/ACT nasal spray Place 2 sprays into both nostrils daily. 05/25/18   Marquavious Nazar, Charlesetta IvoryJenise V Bacon, PA-C  guaiFENesin-codeine 100-10 MG/5ML syrup Take 5 mLs by mouth every 4 (four) hours as needed. 12/05/17   Tommi RumpsSummers, Rhonda L, PA-C  ibuprofen (ADVIL,MOTRIN) 800 MG tablet Take 1 tablet (800 mg total) by mouth every 8 (eight) hours as needed. 02/03/18   Enid DerryWagner, Ashley, PA-C  lidocaine (XYLOCAINE) 2 % solution Use as directed 10 mLs in the mouth or throat as needed for mouth pain. 05/23/18   Enid DerryWagner, Ashley, PA-C  predniSONE (STERAPRED UNI-PAK 21 TAB) 10 MG (21) TBPK tablet 6-day taper as directed. 05/25/18   Yamila Cragin, Charlesetta IvoryJenise V Bacon, PA-C    Allergies Penicillins  History reviewed. No pertinent family history.  Social History Social History   Tobacco Use  . Smoking status: Current Every Day Smoker    Packs/day: 0.50    Types: Cigarettes  . Smokeless tobacco: Never Used  Substance Use Topics  . Alcohol use: Yes  . Drug use: Yes    Review of Systems  Constitutional: Negative for fever. Eyes: Negative for visual changes. ENT: Positive for sore throat & left otalgia. Cardiovascular: Negative for chest pain. Respiratory: Negative for shortness of breath. Gastrointestinal: Negative for abdominal pain,  vomiting and diarrhea. Genitourinary: Negative for dysuria. Musculoskeletal: Negative for back pain. Skin: Negative for rash. Neurological: Negative for headaches, focal weakness or numbness. ____________________________________________  PHYSICAL EXAM:  VITAL SIGNS: ED Triage Vitals  Enc Vitals Group     BP 05/25/18 2008 (!) 149/101     Pulse Rate 05/25/18 2008 90     Resp  05/25/18 2008 16     Temp 05/25/18 2008 98.1 F (36.7 C)     Temp Source 05/25/18 2008 Oral     SpO2 05/25/18 2008 98 %     Weight 05/25/18 2008 190 lb (86.2 kg)     Height 05/25/18 2008 5\' 9"  (1.753 m)     Head Circumference --      Peak Flow --      Pain Score 05/25/18 2014 8     Pain Loc --      Pain Edu? --      Excl. in GC? --     Constitutional: Alert and oriented. Well appearing and in no distress. Head: Normocephalic and atraumatic. Eyes: Conjunctivae are normal. PERRL. Normal extraocular movements Ears: Canals clear. TMs intact bilaterally.  Left TM is injected, retracted, serous effusion is noted.  There is also some transudate noted in the canal.  The TM is otherwise intact.  The patient is tender to palpation along the left eustachian tube at the angle of the jaw. Nose: No congestion/rhinorrhea/epistaxis. Mouth/Throat: Mucous membranes are moist.  Is midline and tonsils are flat.  No oropharyngeal lesions are appreciated.  There is some mild oropharyngeal erythema noted. Neck: Supple. No thyromegaly. Hematological/Lymphatic/Immunological: No cervical lymphadenopathy. Cardiovascular: Normal rate, regular rhythm. Normal distal pulses. Respiratory: Normal respiratory effort. No wheezes/rales/rhonchi. Skin:  Skin is warm, dry and intact. No rash noted. ____________________________________________  PROCEDURES  Procedures Prednisone 60 mg PO Amoxicillin 500 mg PO Norco 5-325 mg PO ____________________________________________  INITIAL IMPRESSION / ASSESSMENT AND PLAN / ED COURSE  Patient with ED evaluation of acute left ear pain in the face of a recurrent viral upper respiratory infection.  Patient's exam reveals a serous otitis media on the left with some early concern for myringitis.  Patient was treated empirically with amoxicillin.  His mother is present and reports that his penicillin allergy was from a single pediatric dose several years prior.  He has reportedly  tolerated amoxicillin in the interim.  He is also given Tylenol 3 for severe pain only.  He will dose over-the-counter allergy medicine plus decongestant and is provided with prescriptions for prednisone and Flonase.  Patient will follow-up with 1 of the local community clinics for ongoing symptom management.  Return precautions have been reviewed.  I reviewed the patient's prescription history over the last 12 months in the multi-state controlled substances database(s) that includes [place], [place], [place], [place], [place], [place], [place], [place], [place], [place], [place], and [place].  Results were notable for no current prescriptions.  ____________________________________________  FINAL CLINICAL IMPRESSION(S) / ED DIAGNOSES  Final diagnoses:  Otalgia of left ear  Non-recurrent acute serous otitis media of left ear  Viral upper respiratory tract infection      Rawley Harju, Charlesetta IvoryJenise V Bacon, PA-C 05/25/18 2241    Emily FilbertWilliams, Jonathan E, MD 05/25/18 [phone]

## 2019-08-13 ENCOUNTER — Emergency Department: Payer: Medicaid Other

## 2019-08-13 ENCOUNTER — Emergency Department
Admission: EM | Admit: 2019-08-13 | Discharge: 2019-08-13 | Disposition: A | Discharge status: Home / Self Care | Payer: Medicaid Other | Attending: Emergency Medicine | Admitting: Emergency Medicine

## 2019-08-13 ENCOUNTER — Other Ambulatory Visit: Payer: Self-pay

## 2019-08-13 DIAGNOSIS — R531 Weakness: Secondary | ICD-10-CM | POA: Insufficient documentation

## 2019-08-13 DIAGNOSIS — R29898 Other symptoms and signs involving the musculoskeletal system: Secondary | ICD-10-CM

## 2019-08-13 DIAGNOSIS — E785 Hyperlipidemia, unspecified: Secondary | ICD-10-CM | POA: Insufficient documentation

## 2019-08-13 DIAGNOSIS — Z20828 Contact with and (suspected) exposure to other viral communicable diseases: Secondary | ICD-10-CM | POA: Diagnosis not present

## 2019-08-13 DIAGNOSIS — R202 Paresthesia of skin: Secondary | ICD-10-CM | POA: Diagnosis present

## 2019-08-13 DIAGNOSIS — F17210 Nicotine dependence, cigarettes, uncomplicated: Secondary | ICD-10-CM | POA: Insufficient documentation

## 2019-08-13 LAB — COMPREHENSIVE METABOLIC PANEL
ALT: 23 U/L (ref 0–44)
AST: 21 U/L (ref 15–41)
Albumin: 3.9 g/dL (ref 3.5–5.0)
Alkaline Phosphatase: 53 U/L (ref 38–126)
Anion gap: 10 (ref 5–15)
BUN: 13 mg/dL (ref 6–20)
CO2: 24 mmol/L (ref 22–32)
Calcium: 8.9 mg/dL (ref 8.9–10.3)
Chloride: 105 mmol/L (ref 98–111)
Creatinine, Ser: 1.33 mg/dL — ABNORMAL HIGH (ref 0.61–1.24)
GFR calc Af Amer: >60 mL/min (ref >60)
GFR calc non Af Amer: >60 mL/min (ref >60)
Glucose, Bld: 98 mg/dL (ref 70–99)
Potassium: 4.3 mmol/L (ref 3.5–5.1)
Sodium: 139 mmol/L (ref 135–145)
Total Bilirubin: 0.6 mg/dL (ref 0.3–1.2)
Total Protein: 7 g/dL (ref 6.5–8.1)

## 2019-08-13 LAB — HEMOGLOBIN A1C
Hgb A1c MFr Bld: 5.7 % — ABNORMAL HIGH (ref 4.8–5.6)
Mean Plasma Glucose: 116.89 mg/dL

## 2019-08-13 LAB — CBC WITH DIFFERENTIAL/PLATELET
Abs Immature Granulocytes: 0.05 10*3/uL (ref 0.00–0.07)
Basophils Absolute: 0.1 10*3/uL (ref 0.0–0.1)
Basophils Relative: 1 %
Eosinophils Absolute: 0.3 10*3/uL (ref 0.0–0.5)
Eosinophils Relative: 2 %
HCT: 44.9 % (ref 39.0–52.0)
Hemoglobin: 15.7 g/dL (ref 13.0–17.0)
Immature Granulocytes: 0 %
Lymphocytes Relative: 17 %
Lymphs Abs: 2.1 10*3/uL (ref 0.7–4.0)
MCH: 32 pg (ref 26.0–34.0)
MCHC: 35 g/dL (ref 30.0–36.0)
MCV: 91.6 fL (ref 80.0–100.0)
Monocytes Absolute: 0.8 10*3/uL (ref 0.1–1.0)
Monocytes Relative: 6 %
Neutro Abs: 9.4 10*3/uL — ABNORMAL HIGH (ref 1.7–7.7)
Neutrophils Relative %: 74 %
Platelets: 201 10*3/uL (ref 150–400)
RBC: 4.9 MIL/uL (ref 4.22–5.81)
RDW: 11.7 % (ref 11.5–15.5)
WBC: 12.8 10*3/uL — ABNORMAL HIGH (ref 4.0–10.5)
nRBC: 0 % (ref 0.0–0.2)

## 2019-08-13 LAB — LIPID PANEL
Cholesterol: 186 mg/dL (ref 0–200)
HDL: 45 mg/dL (ref >40)
LDL Cholesterol: 98 mg/dL (ref 0–99)
Total CHOL/HDL Ratio: 4.1 RATIO
Triglycerides: 216 mg/dL — ABNORMAL HIGH (ref <150)
VLDL: 43 mg/dL — ABNORMAL HIGH (ref 0–40)

## 2019-08-13 MED ORDER — ASPIRIN 81 MG PO CHEW
324.0000 mg | CHEWABLE_TABLET | Freq: Once | ORAL | Status: AC
  Administered 2019-08-13: 324 mg via ORAL
  Filled 2019-08-13: qty 4

## 2019-08-13 MED ORDER — ASPIRIN EC 325 MG PO TBEC: 325.0000 mg | DELAYED_RELEASE_TABLET | Freq: Every day | ORAL | 0 refills | Status: DC

## 2019-08-13 NOTE — ED Triage Notes (Signed)
Pt c/o left arm numbness for almost 2 weeks, denies injury, denies any neck pain or weakness. "feel like when you go to the dentist, numb".the patient also requesting a covid test, states his sons other house hold tested positive for covid but the son tested negative.

## 2019-08-13 NOTE — ED Provider Notes (Signed)
[facility] Emergency Department Provider Note  ____________________________________________  Time seen: Approximately 3:55 PM  I have reviewed the triage vital signs and the nursing notes.   HISTORY  Chief Complaint Numbness    HPI Brett [name] is a 38 y.o. male with a history of bipolar disorder, smoking, alcohol abuse who comes the ED complaining of left arm numbness and weakness for the past 2 weeks.  States that he woke up 1 day with the symptoms after going to bed feeling normal.  No recent injuries.  No headache or vision changes.  No lower extremity weakness.  No aggravating or alleviating factors.  Symptoms have been constant.  He is right-handed.      Past Medical History:  Diagnosis Date  . Depression      Patient Active Problem List   Diagnosis Date Noted  . Tobacco use disorder 01/05/2016  . Alcohol use disorder, severe, dependence ([place]) 01/05/2016  . Alcohol withdrawal ([place]) 01/05/2016  . Cannabis use disorder, moderate, dependence ([place]) 01/05/2016  . Bipolar 2 disorder, major depressive episode ([place]) 01/04/2016  . 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 Chronic prostatitis 12/22/2012  . Hernia, inguinal 12/22/2012     History reviewed. No pertinent surgical history.   Prior to Admission medications   Medication Sig Start Date End Date Taking? Authorizing Provider  amoxicillin (AMOXIL) 500 MG capsule Take 1 capsule (500 mg total) by mouth 3 (three) times daily. 05/25/18   Menshew, Dannielle Karvonen, PA-C  aspirin EC 325 MG tablet Take 1 tablet (325 mg total) by mouth daily. 08/13/19   Carrie Mew, MD  busPIRone (BUSPAR) 5 MG tablet Take 5 mg by mouth 2 (two) times daily.    [provider]  diclofenac (VOLTAREN) 75 MG EC tablet Take 75 mg by mouth 2 (two) times daily.    [provider]  fluticasone (FLONASE) 50 MCG/ACT nasal spray Place 2 sprays into both nostrils daily. 05/25/18    Menshew, Dannielle Karvonen, PA-C  guaiFENesin-codeine 100-10 MG/5ML syrup Take 5 mLs by mouth every 4 (four) hours as needed. 12/05/17   Johnn Hai, PA-C  ibuprofen (ADVIL,MOTRIN) 800 MG tablet Take 1 tablet (800 mg total) by mouth every 8 (eight) hours as needed. 02/03/18   Laban Emperor, PA-C  lidocaine (XYLOCAINE) 2 % solution Use as directed 10 mLs in the mouth or throat as needed for mouth pain. 05/23/18   Laban Emperor, PA-C  predniSONE (STERAPRED UNI-PAK 21 TAB) 10 MG (21) TBPK tablet 6-day taper as directed. 05/25/18   Menshew, Dannielle Karvonen, PA-C     Allergies Penicillins   No family history on file.  Social History Social History   Tobacco Use  . Smoking status: Current Every Day Smoker    Packs/day: 0.50    Types: Cigarettes  . Smokeless tobacco: Never Used  Substance Use Topics  . Alcohol use: Yes  . Drug use: Yes    Review of Systems  Constitutional:   No fever or chills.  ENT:   No sore throat. No rhinorrhea. Cardiovascular:   No chest pain or syncope. Respiratory:   No dyspnea or cough. Gastrointestinal:   Negative for abdominal pain, vomiting and diarrhea.  Musculoskeletal:   Negative for focal pain or swelling All other systems reviewed and are negative except as documented above in ROS and HPI.  ____________________________________________   PHYSICAL EXAM:  VITAL SIGNS: ED Triage Vitals  Enc Vitals Group     BP 08/13/19 1221  130/85     Pulse Rate 08/13/19 1221 (!) 104     Resp 08/13/19 1221 17     Temp 08/13/19 1221 98 F (36.7 C)     Temp Source 08/13/19 1221 Oral     SpO2 08/13/19 1221 96 %     Weight 08/13/19 1223 190 lb (86.2 kg)     Height 08/13/19 1223  (1.778 m)     Head Circumference --      Peak Flow --      Pain Score 08/13/19 1223 0     Pain Loc --      Pain Edu? --      Excl. in GC? --     Vital signs reviewed, nursing assessments reviewed.   Constitutional:   Alert and oriented. Non-toxic appearance. Eyes:    Conjunctivae are normal. EOMI. PERRL. ENT      Head:   Normocephalic and atraumatic.      Nose: Normal.      Mouth/Throat:   Normal, moist mucosa.      Neck:   No meningismus. Full ROM. Spurling test negative.  No midline spinal tenderness Hematological/Lymphatic/Immunilogical:   No cervical lymphadenopathy. Cardiovascular:   RRR. Symmetric bilateral radial and DP pulses.  No murmurs. Cap refill less than 2 seconds. Respiratory:   Normal respiratory effort without tachypnea/retractions. Breath sounds are clear and equal bilaterally. No wheezes/rales/rhonchi. Gastrointestinal:   Soft and nontender. Non distended. There is no CVA tenderness.  No rebound, rigidity, or guarding.  Musculoskeletal:   Normal range of motion in all extremities. No joint effusions.  No lower extremity tenderness.  No edema.  Neurologic:   Normal speech and language.  Cranial nerves III through XII intact Weakness of active ROM of left upper extremity in all major muscle groups compared to the right. (5- vs 5) No motor drift Diffuse mildly diminished sensation of the left upper extremity. No pronator drift.  Normal finger-to-nose. NIH stroke scale 1 Skin:    Skin is warm, dry and intact. No rash noted.  No petechiae, purpura, or bullae.  ____________________________________________    LABS (pertinent positives/negatives) (all labs ordered are listed, but only abnormal results are displayed) Labs Reviewed  COMPREHENSIVE METABOLIC PANEL - Abnormal; Notable for the following components:      Result Value   Creatinine, Ser 1.33 (*)    All other components within normal limits  CBC WITH DIFFERENTIAL/PLATELET - Abnormal; Notable for the following components:   WBC 12.8 (*)    Neutro Abs 9.4 (*)    All other components within normal limits  LIPID PANEL - Abnormal; Notable for the following components:   Triglycerides 216 (*)    VLDL 43 (*)    All other components within normal limits  SARS CORONAVIRUS 2 (TAT  6-24 HRS)  HEMOGLOBIN A1C   ____________________________________________   EKG  Interpreted by me  Date: 08/13/2019  Rate: 71  Rhythm: normal sinus rhythm  QRS Axis: normal  Intervals: normal  ST/T Wave abnormalities: normal  Conduction Disutrbances: none  Narrative Interpretation: unremarkable      ____________________________________________    RADIOLOGY  Dg Chest 2 View  Result Date: 08/13/2019 CLINICAL DATA:  Left upper extremity weakness EXAM: CHEST - 2 VIEW COMPARISON:  May 05, 2015 FINDINGS: There is atelectatic change in the left base region. There is a 1.5 x 1.2 cm nodular opacity in the left mid lung region. Lungs elsewhere clear. Heart size and pulmonary vascularity are normal. No adenopathy. No bone  lesions. IMPRESSION: 1.5 x 1.2 cm nodular opacity left mid lung. This finding was not present previously. This finding warrants noncontrast enhanced chest CT to further assess. There is left base atelectasis. No edema or consolidation. Cardiac silhouette within normal limits. No evident adenopathy. Electronically Signed   By: Bretta BangWilliam  Woodruff III M.D.   On: 08/13/2019 15:06   Ct Head Wo Contrast  Result Date: 08/13/2019 CLINICAL DATA:  Headache and neck pain, left arm paresthesias EXAM: CT HEAD WITHOUT CONTRAST CT CERVICAL SPINE WITHOUT CONTRAST TECHNIQUE: Multidetector CT imaging of the head and cervical spine was performed following the standard protocol without intravenous contrast. Multiplanar CT image reconstructions of the cervical spine were also generated. COMPARISON:  09/05/2015 FINDINGS: CT HEAD FINDINGS Brain: No evidence of acute infarction, hemorrhage, hydrocephalus, extra-axial collection or mass lesion/mass effect. Vascular: No hyperdense vessel or unexpected calcification. Skull: Normal. Negative for fracture or focal lesion. Sinuses/Orbits: Normal appearing orbits. No proptosis. Minor ethmoid mucosal thickening. No sinus air-fluid level. Other sinuses and  mastoids are clear. Other: None. CT CERVICAL SPINE FINDINGS Alignment: Normal. Skull base and vertebrae: No acute fracture. No primary bone lesion or focal pathologic process. Soft tissues and spinal canal: No prevertebral fluid or swelling. No visible canal hematoma. Disc levels: Preserved vertebral body heights and disc spaces. No significant degenerative changes or spondylosis. Facets are aligned. Neural foramina appear patent at all levels. No subluxation or dislocation. Upper chest: Negative. Other: None. IMPRESSION: Normal head CT without contrast. Normal cervical spine CT without acute osseous finding, fracture or malalignment. Electronically Signed   By: Judie PetitM.  Shick M.D.   On: 08/13/2019 15:08   Ct Cervical Spine Wo Contrast  Result Date: 08/13/2019 CLINICAL DATA:  Headache and neck pain, left arm paresthesias EXAM: CT HEAD WITHOUT CONTRAST CT CERVICAL SPINE WITHOUT CONTRAST TECHNIQUE: Multidetector CT imaging of the head and cervical spine was performed following the standard protocol without intravenous contrast. Multiplanar CT image reconstructions of the cervical spine were also generated. COMPARISON:  09/05/2015 FINDINGS: CT HEAD FINDINGS Brain: No evidence of acute infarction, hemorrhage, hydrocephalus, extra-axial collection or mass lesion/mass effect. Vascular: No hyperdense vessel or unexpected calcification. Skull: Normal. Negative for fracture or focal lesion. Sinuses/Orbits: Normal appearing orbits. No proptosis. Minor ethmoid mucosal thickening. No sinus air-fluid level. Other sinuses and mastoids are clear. Other: None. CT CERVICAL SPINE FINDINGS Alignment: Normal. Skull base and vertebrae: No acute fracture. No primary bone lesion or focal pathologic process. Soft tissues and spinal canal: No prevertebral fluid or swelling. No visible canal hematoma. Disc levels: Preserved vertebral body heights and disc spaces. No significant degenerative changes or spondylosis. Facets are aligned. Neural  foramina appear patent at all levels. No subluxation or dislocation. Upper chest: Negative. Other: None. IMPRESSION: Normal head CT without contrast. Normal cervical spine CT without acute osseous finding, fracture or malalignment. Electronically Signed   By: Judie PetitM.  Shick M.D.   On: 08/13/2019 15:08    ____________________________________________   PROCEDURES Procedures  ____________________________________________  DIFFERENTIAL DIAGNOSIS   Cervical radiculopathy, stroke, brachial plexus injury  CLINICAL IMPRESSION / ASSESSMENT AND PLAN / ED COURSE  Medications ordered in the ED: Medications  aspirin chewable tablet 324 mg (324 mg Oral Given 08/13/19 1941)    Pertinent labs & imaging results that were available during my care of the patient were reviewed by me and considered in my medical decision making (see chart for details).  Brett [name] was evaluated in Emergency Department on 08/13/2019 for the symptoms described in the history of present  illness. He was evaluated in the context of the global COVID-19 pandemic, which necessitated consideration that the patient might be at risk for infection with the SARS-CoV-2 virus that causes COVID-19. Institutional protocols and algorithms that pertain to the evaluation of patients at risk for COVID-19 are in a state of rapid change based on information released by regulatory bodies including the CDC and federal and state organizations. These policies and algorithms were followed during the patient's care in the ED.   Patient presents with weakness and paresthesia of the left upper extremity, subacute with duration of about 2 weeks.  No known medical history, not had any particularly elevated stroke risk.  Will obtain CT head and cervical spine, check labs.  Clinical Course as of Aug 13 1955  Fri Aug 13, 2019  1736 Initial labs unremarkable except for elevated VLDL and triglyceride.  Recommended diet modification and increased exercise to  patient.  CT head and C-spine unremarkable.  Engaged in shared decision-making with patient,, offered hospitalization for stroke work-up which she declines and prefers to go home if at all possible.  I will obtain MRI brain and C-spine in the ED to further evaluate for possible subacute stroke versus cervical radiculopathy.  If reassuring, would be reasonable for him to pursue outpatient follow-up   [PS]    Clinical Course User Index [PS] Sharman Cheek, MD     ----------------------------------------- 7:56 PM on 08/13/2019 -----------------------------------------  Patient unable to wait any longer for the MRI.  No worsening of symptoms, patient feels well, steady gait.  Has medical decision-making capacity.  I will continue him on daily aspirin, he will plan to follow-up with primary care or neurology this week.  More likely this is a cervical radiculopathy or brachial plexus neuropraxia than stroke.  ____________________________________________   FINAL CLINICAL IMPRESSION(S) / ED DIAGNOSES    Final diagnoses:  Hyperlipidemia, unspecified hyperlipidemia type  LUE weakness     ED Discharge Orders         Ordered    aspirin EC 325 MG tablet  Daily     08/13/19 1953          Portions of this note were generated with dragon dictation software. Dictation errors may occur despite best attempts at proofreading.   Sharman Cheek, MD 08/13/19 2003

## 2019-08-13 NOTE — ED Notes (Signed)
Patient transported to CT 

## 2019-08-13 NOTE — Discharge Instructions (Addendum)
Your symptoms are concerning for a possible stroke, but your CT scan of the head is reassuring.  Please follow up with primary care of neurology for further evaluation of your symptoms. Take aspirin daily until you are able to follow up to help protect you from worsening symptoms.

## 2019-08-13 NOTE — ED Notes (Signed)
PT requesting to leave. MD made aware. Orders for baby aspirin. MD states he will write for a follow up for pt.

## 2019-08-14 LAB — SARS CORONAVIRUS 2 (TAT 6-24 HRS): SARS Coronavirus 2: NEGATIVE

## 2019-08-18 ENCOUNTER — Other Ambulatory Visit: Payer: Self-pay

## 2019-08-18 ENCOUNTER — Emergency Department
Admission: EM | Admit: 2019-08-18 | Discharge: 2019-08-18 | Disposition: A | Discharge status: Home / Self Care | Payer: Medicaid Other | Attending: Student in an Organized Health Care Education/Training Program | Admitting: Student in an Organized Health Care Education/Training Program

## 2019-08-18 ENCOUNTER — Emergency Department: Payer: Medicaid Other

## 2019-08-18 ENCOUNTER — Encounter: Payer: Self-pay | Admitting: Medical Oncology

## 2019-08-18 DIAGNOSIS — Z79899 Other long term (current) drug therapy: Secondary | ICD-10-CM | POA: Diagnosis not present

## 2019-08-18 DIAGNOSIS — R202 Paresthesia of skin: Secondary | ICD-10-CM | POA: Diagnosis not present

## 2019-08-18 DIAGNOSIS — R519 Headache, unspecified: Secondary | ICD-10-CM | POA: Insufficient documentation

## 2019-08-18 DIAGNOSIS — F17210 Nicotine dependence, cigarettes, uncomplicated: Secondary | ICD-10-CM | POA: Diagnosis not present

## 2019-08-18 DIAGNOSIS — Z7982 Long term (current) use of aspirin: Secondary | ICD-10-CM | POA: Diagnosis not present

## 2019-08-18 DIAGNOSIS — R200 Anesthesia of skin: Secondary | ICD-10-CM | POA: Diagnosis present

## 2019-08-18 DIAGNOSIS — R0789 Other chest pain: Secondary | ICD-10-CM | POA: Insufficient documentation

## 2019-08-18 LAB — CBC WITH DIFFERENTIAL/PLATELET
Abs Immature Granulocytes: 0.02 10*3/uL (ref 0.00–0.07)
Basophils Absolute: 0.1 10*3/uL (ref 0.0–0.1)
Basophils Relative: 1 %
Eosinophils Absolute: 0.3 10*3/uL (ref 0.0–0.5)
Eosinophils Relative: 3 %
HCT: 47.6 % (ref 39.0–52.0)
Hemoglobin: 16.7 g/dL (ref 13.0–17.0)
Immature Granulocytes: 0 %
Lymphocytes Relative: 24 %
Lymphs Abs: 2 10*3/uL (ref 0.7–4.0)
MCH: 32 pg (ref 26.0–34.0)
MCHC: 35.1 g/dL (ref 30.0–36.0)
MCV: 91.2 fL (ref 80.0–100.0)
Monocytes Absolute: 0.5 10*3/uL (ref 0.1–1.0)
Monocytes Relative: 6 %
Neutro Abs: 5.3 10*3/uL (ref 1.7–7.7)
Neutrophils Relative %: 66 %
Platelets: 208 10*3/uL (ref 150–400)
RBC: 5.22 MIL/uL (ref 4.22–5.81)
RDW: 11.6 % (ref 11.5–15.5)
WBC: 8.1 10*3/uL (ref 4.0–10.5)
nRBC: 0 % (ref 0.0–0.2)

## 2019-08-18 LAB — BASIC METABOLIC PANEL
Anion gap: 12 (ref 5–15)
BUN: 8 mg/dL (ref 6–20)
CO2: 21 mmol/L — ABNORMAL LOW (ref 22–32)
Calcium: 8.9 mg/dL (ref 8.9–10.3)
Chloride: 106 mmol/L (ref 98–111)
Creatinine, Ser: 1.06 mg/dL (ref 0.61–1.24)
GFR calc Af Amer: >60 mL/min (ref >60)
GFR calc non Af Amer: >60 mL/min (ref >60)
Glucose, Bld: 108 mg/dL — ABNORMAL HIGH (ref 70–99)
Potassium: 4.2 mmol/L (ref 3.5–5.1)
Sodium: 139 mmol/L (ref 135–145)

## 2019-08-18 LAB — TROPONIN I (HIGH SENSITIVITY): Troponin I (High Sensitivity): <2 ng/L (ref <18)

## 2019-08-18 MED ORDER — BUTALBITAL-APAP-CAFFEINE 50-325-40 MG PO TABS
1.0000 | ORAL_TABLET | Freq: Four times a day (QID) | ORAL | Status: DC | PRN
  Administered 2019-08-18: 1 via ORAL
  Filled 2019-08-18: qty 1

## 2019-08-18 NOTE — ED Notes (Signed)
Spoke with Dr Jimmye Norman about pts care from triage- MRI ordered, no additional orders necessary.

## 2019-08-18 NOTE — ED Triage Notes (Signed)
Pt ambulatory to triage with reports of left arm numbness for over 2 weeks, was seen here last Friday and told he needed to see neuro. Unable to get apt until dec 4. Pt reports that the numbness is worse and now he's beginning to have a headache. Pt A/O x 4.

## 2019-08-18 NOTE — ED Provider Notes (Signed)
[facility] Emergency Department Provider Note    First MD Initiated Contact with Patient 08/18/19 1246     (approximate)  I have reviewed the triage vital signs and the nursing notes.   HISTORY  Chief Complaint Numbness    HPI Brett [name] is a 38 y.o. male close past medical history presents the ER for 2 weeks of progressively worsening tingling and discomfort of the left arm.  Feels like it is worse at night.  Denies any recent trauma.  Had extensive work-up in the ER just recently and was scheduled to see neurology as an outpatient but feels that the symptoms are getting worse.  Also complaining of a little bit of chest discomfort.  States it is constant.  No diaphoresis.  No radiation of the pain.  He does smoke 1 pack cigarettes per day.  Denies any blurry vision.  No other neuro symptoms.    Past Medical History:  Diagnosis Date  . Depression    No family history on file. No past surgical history on file. Patient Active Problem List   Diagnosis Date Noted  . Tobacco use disorder 01/05/2016  . Alcohol use disorder, severe, dependence ([place]) 01/05/2016  . Alcohol withdrawal ([place]) 01/05/2016  . Cannabis use disorder, moderate, dependence ([place]) 01/05/2016  . Bipolar 2 disorder, major depressive episode ([place]) 01/04/2016  . 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 Chronic prostatitis 12/22/2012  . Hernia, inguinal 12/22/2012      Prior to Admission medications   Medication Sig Start Date End Date Taking? Authorizing Provider  amoxicillin (AMOXIL) 500 MG capsule Take 1 capsule (500 mg total) by mouth 3 (three) times daily. 05/25/18   Menshew, Dannielle Karvonen, PA-C  aspirin EC 325 MG tablet Take 1 tablet (325 mg total) by mouth daily. 08/13/19   Carrie Mew, MD  busPIRone (BUSPAR) 5 MG tablet Take 5 mg by mouth 2 (two) times daily.    [provider]  diclofenac (VOLTAREN) 75 MG EC tablet Take 75 mg by mouth 2  (two) times daily.    [provider]  fluticasone (FLONASE) 50 MCG/ACT nasal spray Place 2 sprays into both nostrils daily. 05/25/18   Menshew, Dannielle Karvonen, PA-C  guaiFENesin-codeine 100-10 MG/5ML syrup Take 5 mLs by mouth every 4 (four) hours as needed. 12/05/17   Johnn Hai, PA-C  ibuprofen (ADVIL,MOTRIN) 800 MG tablet Take 1 tablet (800 mg total) by mouth every 8 (eight) hours as needed. 02/03/18   Laban Emperor, PA-C  lidocaine (XYLOCAINE) 2 % solution Use as directed 10 mLs in the mouth or throat as needed for mouth pain. 05/23/18   Laban Emperor, PA-C  predniSONE (STERAPRED UNI-PAK 21 TAB) 10 MG (21) TBPK tablet 6-day taper as directed. 05/25/18   Menshew, Dannielle Karvonen, PA-C    Allergies Penicillins    Social History Social History   Tobacco Use  . Smoking status: Current Every Day Smoker    Packs/day: 0.50    Types: Cigarettes  . Smokeless tobacco: Never Used  Substance Use Topics  . Alcohol use: Yes  . Drug use: Yes    Review of Systems Patient denies headaches, rhinorrhea, blurry vision, numbness, shortness of breath, chest pain, edema, cough, abdominal pain, nausea, vomiting, diarrhea, dysuria, fevers, rashes or hallucinations unless otherwise stated above in HPI. ____________________________________________   PHYSICAL EXAM:  VITAL SIGNS: Vitals:   08/18/19 1050  BP: (!) 154/96  Pulse: (!) 117  Resp: 18  Temp: 98.2  F (36.8 C)  SpO2: 97%    Constitutional: Alert and oriented.  Eyes: Conjunctivae are normal.  Head: Atraumatic. Nose: No congestion/rhinnorhea. Mouth/Throat: Mucous membranes are moist.   Neck: No stridor. Painless ROM.  Cardiovascular: Normal rate, regular rhythm. Grossly normal heart sounds.  Good peripheral circulation. Respiratory: Normal respiratory effort.  No retractions. Lungs CTAB. Gastrointestinal: Soft and nontender. No distention. No abdominal bruits. No CVA tenderness. Genitourinary:  Musculoskeletal: No lower  extremity tenderness nor edema.  No joint effusions.  Good peripheral perfusion.  2+ radial and ulnar pulses bilateral upper extremities.  Symptoms worsened with tinels and Phalen sign of LUE Neurologic:  Normal speech and language.  Subjective decrease in sensation of the palm extending up the ulnar aspect to the elbow.  Slight decrease in grip strength.  Able to move all extremities.  Good strength against gravity.   Skin:  Skin is warm, dry and intact. No rash noted. Psychiatric: Mood and affect are normal. Speech and behavior are normal.  ____________________________________________   LABS (all labs ordered are listed, but only abnormal results are displayed)  Results for orders placed or performed during the hospital encounter of 08/18/19 (from the past 24 hour(s))  CBC with Differential/Platelet     Status: None   Collection Time: 08/18/19  1:20 PM  Result Value Ref Range   WBC 8.1 4.0 - 10.5 K/uL   RBC 5.22 4.22 - 5.81 MIL/uL   Hemoglobin 16.7 13.0 - 17.0 g/dL   HCT 16.1 09.6 - 04.5 %   MCV 91.2 80.0 - 100.0 fL   MCH 32.0 26.0 - 34.0 pg   MCHC 35.1 30.0 - 36.0 g/dL   RDW 40.9 81.1 - 91.4 %   Platelets 208 150 - 400 K/uL   nRBC 0.0 0.0 - 0.2 %   Neutrophils Relative % 66 %   Neutro Abs 5.3 1.7 - 7.7 K/uL   Lymphocytes Relative 24 %   Lymphs Abs 2.0 0.7 - 4.0 K/uL   Monocytes Relative 6 %   Monocytes Absolute 0.5 0.1 - 1.0 K/uL   Eosinophils Relative 3 %   Eosinophils Absolute 0.3 0.0 - 0.5 K/uL   Basophils Relative 1 %   Basophils Absolute 0.1 0.0 - 0.1 K/uL   Immature Granulocytes 0 %   Abs Immature Granulocytes 0.02 0.00 - 0.07 K/uL  Basic metabolic panel     Status: Abnormal   Collection Time: 08/18/19  1:20 PM  Result Value Ref Range   Sodium 139 135 - 145 mmol/L   Potassium 4.2 3.5 - 5.1 mmol/L   Chloride 106 98 - 111 mmol/L   CO2 21 (L) 22 - 32 mmol/L   Glucose, Bld 108 (H) 70 - 99 mg/dL   BUN 8 6 - 20 mg/dL   Creatinine, Ser 7.82 0.61 - 1.24 mg/dL   Calcium  8.9 8.9 - 95.6 mg/dL   GFR calc non Af Amer >60 >60 mL/min   GFR calc Af Amer >60 >60 mL/min   Anion gap 12 5 - 15  Troponin I (High Sensitivity)     Status: None   Collection Time: 08/18/19  1:20 PM  Result Value Ref Range   Troponin I (High Sensitivity) <2 <18 ng/L   ____________________________________________  EKG My review and personal interpretation at Time: 13:06   Indication: chest pauin  Rate: 75  Rhythm: sinus Axis: normal Other: normal intervals, no stemi, unchanged from previous ECG ____________________________________________  RADIOLOGY  I personally reviewed all radiographic images ordered to evaluate  for the above acute complaints and reviewed radiology reports and findings.  These findings were personally discussed with the patient.  Please see medical record for radiology report.  ____________________________________________   PROCEDURES  Procedure(s) performed:  Procedures    Critical Care performed: no ____________________________________________   INITIAL IMPRESSION / ASSESSMENT AND PLAN / ED COURSE  Pertinent labs & imaging results that were available during my care of the patient were reviewed by me and considered in my medical decision making (see chart for details).   DDX: cva, mass, neuropraxia, radiculopathy, acs, limb ischemia  Brett [name] is a 38 y.o. who presents to the ED with symptoms as described above.  Symptoms were worsened with palpation of the volar wrist suggestive of carpal tunnel syndrome particular in the setting of MRI imaging that does not show any evidence of CVA mass herniated disc or central cord abnormality.  He has good perfusion distally.  Is not consistent with ischemic limb.  Buttock is reassuring.  Not consistent with ACS.  Discussed conservative management and referral to outpatient work-up.  Discussed signs and symptoms for which he should return to the ER.     The patient was evaluated in Emergency Department  today for the symptoms described in the history of present illness. He/she was evaluated in the context of the global COVID-19 pandemic, which necessitated consideration that the patient might be at risk for infection with the SARS-CoV-2 virus that causes COVID-19. Institutional protocols and algorithms that pertain to the evaluation of patients at risk for COVID-19 are in a state of rapid change based on information released by regulatory bodies including the CDC and federal and state organizations. These policies and algorithms were followed during the patient's care in the ED.  As part of my medical decision making, I reviewed the following data within the electronic MEDICAL RECORD NUMBER Nursing notes reviewed and incorporated, Labs reviewed, notes from prior ED visits and [place] Controlled Substance Database   ____________________________________________   FINAL CLINICAL IMPRESSION(S) / ED DIAGNOSES  Final diagnoses:  Paresthesia      NEW MEDICATIONS STARTED DURING THIS VISIT:  New Prescriptions   No medications on file     Note:  This document was prepared using Dragon voice recognition software and may include unintentional dictation errors.    Willy Eddyobinson, Crisol Muecke, MD 08/18/19 [phone]

## 2019-09-30 DIAGNOSIS — R531 Weakness: Secondary | ICD-10-CM | POA: Insufficient documentation

## 2019-09-30 DIAGNOSIS — R200 Anesthesia of skin: Secondary | ICD-10-CM | POA: Insufficient documentation

## 2019-09-30 DIAGNOSIS — M79602 Pain in left arm: Secondary | ICD-10-CM | POA: Insufficient documentation

## 2019-12-15 ENCOUNTER — Emergency Department
Admission: EM | Admit: 2019-12-15 | Discharge: 2019-12-15 | Disposition: A | Discharge status: Home / Self Care | Payer: Medicaid Other | Attending: Emergency Medicine | Admitting: Emergency Medicine

## 2019-12-15 ENCOUNTER — Encounter: Payer: Self-pay | Admitting: Emergency Medicine

## 2019-12-15 ENCOUNTER — Emergency Department: Payer: Medicaid Other

## 2019-12-15 ENCOUNTER — Other Ambulatory Visit: Payer: Self-pay

## 2019-12-15 DIAGNOSIS — F17210 Nicotine dependence, cigarettes, uncomplicated: Secondary | ICD-10-CM | POA: Insufficient documentation

## 2019-12-15 DIAGNOSIS — R109 Unspecified abdominal pain: Secondary | ICD-10-CM | POA: Diagnosis present

## 2019-12-15 LAB — CBC WITH DIFFERENTIAL/PLATELET
Abs Immature Granulocytes: 0.02 10*3/uL (ref 0.00–0.07)
Basophils Absolute: 0.1 10*3/uL (ref 0.0–0.1)
Basophils Relative: 2 %
Eosinophils Absolute: 0.3 10*3/uL (ref 0.0–0.5)
Eosinophils Relative: 6 %
HCT: 45.3 % (ref 39.0–52.0)
Hemoglobin: 16.3 g/dL (ref 13.0–17.0)
Immature Granulocytes: 0 %
Lymphocytes Relative: 29 %
Lymphs Abs: 1.5 10*3/uL (ref 0.7–4.0)
MCH: 32.9 pg (ref 26.0–34.0)
MCHC: 36 g/dL (ref 30.0–36.0)
MCV: 91.5 fL (ref 80.0–100.0)
Monocytes Absolute: 0.4 10*3/uL (ref 0.1–1.0)
Monocytes Relative: 8 %
Neutro Abs: 2.9 10*3/uL (ref 1.7–7.7)
Neutrophils Relative %: 55 %
Platelets: 197 10*3/uL (ref 150–400)
RBC: 4.95 MIL/uL (ref 4.22–5.81)
RDW: 11.6 % (ref 11.5–15.5)
WBC: 5.3 10*3/uL (ref 4.0–10.5)
nRBC: 0 % (ref 0.0–0.2)

## 2019-12-15 LAB — COMPREHENSIVE METABOLIC PANEL
ALT: 21 U/L (ref 0–44)
AST: 18 U/L (ref 15–41)
Albumin: 3.9 g/dL (ref 3.5–5.0)
Alkaline Phosphatase: 48 U/L (ref 38–126)
Anion gap: 10 (ref 5–15)
BUN: 8 mg/dL (ref 6–20)
CO2: 24 mmol/L (ref 22–32)
Calcium: 9.1 mg/dL (ref 8.9–10.3)
Chloride: 106 mmol/L (ref 98–111)
Creatinine, Ser: 0.93 mg/dL (ref 0.61–1.24)
GFR calc Af Amer: >60 mL/min (ref >60)
GFR calc non Af Amer: >60 mL/min (ref >60)
Glucose, Bld: 90 mg/dL (ref 70–99)
Potassium: 4.4 mmol/L (ref 3.5–5.1)
Sodium: 140 mmol/L (ref 135–145)
Total Bilirubin: 0.7 mg/dL (ref 0.3–1.2)
Total Protein: 6.7 g/dL (ref 6.5–8.1)

## 2019-12-15 LAB — LIPASE, BLOOD: Lipase: 24 U/L (ref 11–51)

## 2019-12-15 MED ORDER — KETOROLAC TROMETHAMINE 30 MG/ML IJ SOLN
30.0000 mg | Freq: Once | INTRAMUSCULAR | Status: AC
  Administered 2019-12-15: 11:00:00 30 mg via INTRAVENOUS
  Filled 2019-12-15: qty 1

## 2019-12-15 MED ORDER — DIAZEPAM 5 MG PO TABS: 5.0000 mg | ORAL_TABLET | Freq: Three times a day (TID) | ORAL | 0 refills | Status: DC | PRN

## 2019-12-15 MED ORDER — IBUPROFEN 600 MG PO TABS: 600.0000 mg | ORAL_TABLET | Freq: Three times a day (TID) | ORAL | 0 refills | Status: DC | PRN

## 2019-12-15 NOTE — ED Notes (Signed)
Pt transported to CT ?

## 2019-12-15 NOTE — ED Triage Notes (Signed)
Pt reports started with pain to his left side/flank 6 days ago. Pt reports the pain was intermittent and hurt worse when he coughed or laughed but for the last 2 days the pain has worsened and been consistent.

## 2019-12-15 NOTE — ED Provider Notes (Signed)
[facility] Emergency Department Provider Note       Time seen: ----------------------------------------- 10:11 AM on 12/15/2019 -----------------------------------------   I have reviewed the triage vital signs and the nursing notes.  HISTORY   Chief Complaint Flank Pain    HPI Brett [name] is a 39 y.o. male with a history of depression, alcohol use disorder, cannabis use, inguinal hernia who presents to the ED for left flank pain for the last 6 days.  Patient states the pain was intermittent and hurt worse when he coughed or laughed but the past 2 days have been worse.  Patient describes the pain as sharp and in the left flank.  Past Medical History:  Diagnosis Date  . Depression     Patient Active Problem List   Diagnosis Date Noted  . Tobacco use disorder 01/05/2016  . Alcohol use disorder, severe, dependence (HCC) 01/05/2016  . Alcohol withdrawal (HCC) 01/05/2016  . Cannabis use disorder, moderate, dependence (HCC) 01/05/2016  . Bipolar 2 disorder, major depressive episode (HCC) 01/04/2016  . 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 Chronic prostatitis 12/22/2012  . Hernia, inguinal 12/22/2012    History reviewed. No pertinent surgical history.  Allergies Penicillins  Social History Social History   Tobacco Use  . Smoking status: Current Every Day Smoker    Packs/day: 0.50    Types: Cigarettes  . Smokeless tobacco: Never Used  Substance Use Topics  . Alcohol use: Yes  . Drug use: Yes   Review of Systems Constitutional: Negative for fever. Cardiovascular: Negative for chest pain. Respiratory: Negative for shortness of breath. Gastrointestinal: Positive for flank pain Musculoskeletal: Negative for back pain. Skin: Negative for rash. Neurological: Negative for headaches, focal weakness or numbness.  All systems negative/normal/unremarkable except as stated in the  HPI  ____________________________________________   PHYSICAL EXAM:  VITAL SIGNS: ED Triage Vitals  Enc Vitals Group     BP 12/15/19 1006 122/81     Pulse Rate 12/15/19 1006 100     Resp 12/15/19 1006 20     Temp 12/15/19 1006 97.8 F (36.6 C)     Temp Source 12/15/19 1006 Oral     SpO2 12/15/19 1006 95 %     Weight 12/15/19 1003 200 lb (90.7 kg)     Height 12/15/19 1003 5\' 10"  (1.778 m)     Head Circumference --      Peak Flow --      Pain Score 12/15/19 1003 4     Pain Loc --      Pain Edu? --      Excl. in GC? --    Constitutional: Alert and oriented. Well appearing and in no distress. Eyes: Conjunctivae are normal. Normal extraocular movements. Cardiovascular: Normal rate, regular rhythm. No murmurs, rubs, or gallops. Respiratory: Normal respiratory effort without tachypnea nor retractions. Breath sounds are clear and equal bilaterally. No wheezes/rales/rhonchi. Gastrointestinal: Soft and nontender. Normal bowel sounds Musculoskeletal: Nontender with normal range of motion in extremities. No lower extremity tenderness nor edema. Neurologic:  Normal speech and language. No gross focal neurologic deficits are appreciated.  Skin:  Skin is warm, dry and intact. No rash noted. Psychiatric: Mood and affect are normal. Speech and behavior are normal.  ____________________________________________  ED COURSE:  As part of my medical decision making, I reviewed the following data within the electronic MEDICAL RECORD NUMBER History obtained from family if available, nursing notes, old chart and ekg, as well as notes from prior ED visits. Patient  presented for left flank pain, we will assess with labs and imaging as indicated at this time.   Procedures  Brett [name] Hand was evaluated in Emergency Department on 12/15/2019 for the symptoms described in the history of present illness. He was evaluated in the context of the global COVID-19 pandemic, which necessitated consideration that  the patient might be at risk for infection with the SARS-CoV-2 virus that causes COVID-19. Institutional protocols and algorithms that pertain to the evaluation of patients at risk for COVID-19 are in a state of rapid change based on information released by regulatory bodies including the CDC and federal and state organizations. These policies and algorithms were followed during the patient's care in the ED.  ____________________________________________   LABS (pertinent positives/negatives)  Labs Reviewed  CBC WITH DIFFERENTIAL/PLATELET  COMPREHENSIVE METABOLIC PANEL  LIPASE, BLOOD    RADIOLOGY Images were viewed by me CT renal protocol IMPRESSION:  Benign-appearing abdomen and pelvis. Congenital absence of the left  kidney.   ____________________________________________   DIFFERENTIAL DIAGNOSIS   Renal colic, UTI, pyelonephritis, muscle strain  FINAL ASSESSMENT AND PLAN  Left flank pain   Plan: The patient had presented for left leg pain. Patient's labs are unremarkable. Patient's imaging did not reveal any acute process. He does not have a left kidney. No clear etiology for his symptoms. He is cleared for outpatient follow-up.   Laurence Aly, MD    Note: This note was generated in part or whole with voice recognition software. Voice recognition is usually quite accurate but there are transcription errors that can and very often do occur. I apologize for any typographical errors that were not detected and corrected.     Earleen Newport, MD 12/15/19 1110

## 2019-12-15 NOTE — ED Notes (Signed)
EDP, Williams to bedside. 

## 2020-03-03 ENCOUNTER — Emergency Department: Payer: Medicaid Other

## 2020-03-03 ENCOUNTER — Encounter: Payer: Self-pay | Admitting: Medical Oncology

## 2020-03-03 ENCOUNTER — Other Ambulatory Visit: Payer: Self-pay

## 2020-03-03 ENCOUNTER — Emergency Department
Admission: EM | Admit: 2020-03-03 | Discharge: 2020-03-03 | Disposition: A | Discharge status: Home / Self Care | Payer: Medicaid Other | Attending: Emergency Medicine | Admitting: Emergency Medicine

## 2020-03-03 DIAGNOSIS — X58XXXA Exposure to other specified factors, initial encounter: Secondary | ICD-10-CM | POA: Diagnosis not present

## 2020-03-03 DIAGNOSIS — Y999 Unspecified external cause status: Secondary | ICD-10-CM | POA: Diagnosis not present

## 2020-03-03 DIAGNOSIS — Y929 Unspecified place or not applicable: Secondary | ICD-10-CM | POA: Insufficient documentation

## 2020-03-03 DIAGNOSIS — F17210 Nicotine dependence, cigarettes, uncomplicated: Secondary | ICD-10-CM | POA: Diagnosis not present

## 2020-03-03 DIAGNOSIS — Y9389 Activity, other specified: Secondary | ICD-10-CM | POA: Diagnosis not present

## 2020-03-03 DIAGNOSIS — S5011XA Contusion of right forearm, initial encounter: Secondary | ICD-10-CM | POA: Diagnosis not present

## 2020-03-03 DIAGNOSIS — S59911A Unspecified injury of right forearm, initial encounter: Secondary | ICD-10-CM | POA: Diagnosis present

## 2020-03-03 NOTE — Discharge Instructions (Signed)
Your x-ray was negative for fracture.  Follow discharge care instructions using heat instead of ice.  Advised Tylenol/ibuprofen for 2 to 3 days.

## 2020-03-03 NOTE — ED Triage Notes (Signed)
Pt reports he was swinging golf club Sunday and felt a pop to his rt forearm, yesterday he noted bruising to forearm.

## 2020-03-03 NOTE — ED Provider Notes (Signed)
[facility] Emergency Department Provider Note   ____________________________________________   First MD Initiated Contact with Patient 03/03/20 1049     (approximate)  I have reviewed the triage vital signs and the nursing notes.   HISTORY  Chief Complaint Bleeding/Bruising    HPI Brett [name] is a 39 y.o. male patient presents with pain to the right forearm for 5 days.  Patient state he was swinging a golf club when it hit the ground he felt a "pop" in his right forearm.  Patient states he noticed the bruising yesterday.  Patient denies loss of sensation or loss of function.  Patient is right-hand dominant.  Patient rates her pain as a 5/10.  Patient described the pain as "sore".  No palliative measure for complaint.         Past Medical History:  Diagnosis Date  . Depression     Patient Active Problem List   Diagnosis Date Noted  . Tobacco use disorder 01/05/2016  . Alcohol use disorder, severe, dependence ([place]) 01/05/2016  . Alcohol withdrawal ([place]) 01/05/2016  . Cannabis use disorder, moderate, dependence ([place]) 01/05/2016  . Bipolar 2 disorder, major depressive episode ([place]) 01/04/2016  . 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 Chronic prostatitis 12/22/2012  . Hernia, inguinal 12/22/2012    History reviewed. No pertinent surgical history.  Prior to Admission medications   Medication Sig Start Date End Date Taking? Authorizing Provider  aspirin EC 325 MG tablet Take 1 tablet (325 mg total) by mouth daily. 08/13/19   Carrie Mew, MD  busPIRone (BUSPAR) 5 MG tablet Take 5 mg by mouth 2 (two) times daily.    [provider]  diazepam (VALIUM) 5 MG tablet Take 1 tablet (5 mg total) by mouth every 8 (eight) hours as needed for muscle spasms. 12/15/19   Earleen Newport, MD  diclofenac (VOLTAREN) 75 MG EC tablet Take 75 mg by mouth 2 (two) times daily.    [provider]  fluticasone (FLONASE)  50 MCG/ACT nasal spray Place 2 sprays into both nostrils daily. 05/25/18   Menshew, Dannielle Karvonen, PA-C    Allergies Penicillins  No family history on file.  Social History Social History   Tobacco Use  . Smoking status: Current Every Day Smoker    Packs/day: 0.50    Types: Cigarettes  . Smokeless tobacco: Never Used  Substance Use Topics  . Alcohol use: Yes  . Drug use: Yes    Review of Systems  Constitutional: No fever/chills Eyes: No visual changes. ENT: No sore throat. Cardiovascular: Denies chest pain. Respiratory: Denies shortness of breath. Gastrointestinal: No abdominal pain.  No nausea, no vomiting.  No diarrhea.  No constipation. Genitourinary: Negative for dysuria. Musculoskeletal: Negative for back pain. Skin: Negative for rash. Neurological: Negative for headaches, focal weakness or numbness. Psychiatric:  Depression  ____________________________________________   PHYSICAL EXAM:  VITAL SIGNS: ED Triage Vitals  Enc Vitals Group     BP 03/03/20 0905 (!) 151/99     Pulse Rate 03/03/20 0905 87     Resp 03/03/20 0905 18     Temp 03/03/20 0905 98.6 F (37 C)     Temp Source 03/03/20 0905 Oral     SpO2 03/03/20 0905 97 %     Weight 03/03/20 0902 180 lb (81.6 kg)     Height 03/03/20 0902 5\' 11"  (1.803 m)     Head Circumference --      Peak Flow --  Pain Score 03/03/20 0900 5     Pain Loc --      Pain Edu? --      Excl. in GC? --    Constitutional: Alert and oriented. Well appearing and in no acute distress. Cardiovascular: Normal rate, regular rhythm. Grossly normal heart sounds.  Good peripheral circulation.  Mildly elevated blood pressure. Respiratory: Normal respiratory effort.  No retractions. Lungs CTAB. Neurologic:  Normal speech and language. No gross focal neurologic deficits are appreciated. No gait instability. Skin:  Skin is warm, dry and intact. No rash noted.  Ecchymosis right forearm. Psychiatric: Mood and affect are normal.  Speech and behavior are normal.  ____________________________________________   LABS (all labs ordered are listed, but only abnormal results are displayed)  Labs Reviewed - No data to display ____________________________________________  EKG   ____________________________________________  RADIOLOGY  ED MD interpretation:    Official radiology report(s): DG Forearm Right  Result Date: 03/03/2020 CLINICAL DATA:  Right forearm pain and bruising after feeling a popping sensation when swinging a golf club 5 days ago. EXAM: RIGHT FOREARM - 2 VIEW COMPARISON:  None. FINDINGS: There is no evidence of fracture or other focal bone lesions. Soft tissues are unremarkable. IMPRESSION: Normal examination. Electronically Signed   By: Beckie Salts M.D.   On: 03/03/2020 09:27    ____________________________________________   PROCEDURES  Procedure(s) performed (including Critical Care):  Procedures   ____________________________________________   INITIAL IMPRESSION / ASSESSMENT AND PLAN / ED COURSE  As part of my medical decision making, I reviewed the following data within the electronic MEDICAL RECORD NUMBER     Patient presents with pain and ecchymosis of the right foot forearm.  Discussed negative x-ray findings with patient.  Patient complaining physical exam consistent with contusion.  Patient given discharge care instructions and advised over-the-counter ibuprofen or Tylenol as needed for pain.  Follow-up with PCP.   Brett [name] was evaluated in Emergency Department on 03/03/2020 for the symptoms described in the history of present illness. He was evaluated in the context of the global COVID-19 pandemic, which necessitated consideration that the patient might be at risk for infection with the SARS-CoV-2 virus that causes COVID-19. Institutional protocols and algorithms that pertain to the evaluation of patients at risk for COVID-19 are in a state of rapid change based on  information released by regulatory bodies including the CDC and federal and state organizations. These policies and algorithms were followed during the patient's care in the ED.       ____________________________________________   FINAL CLINICAL IMPRESSION(S) / ED DIAGNOSES  Final diagnoses:  Contusion of right forearm, initial encounter     ED Discharge Orders    None       Note:  This document was prepared using Dragon voice recognition software and may include unintentional dictation errors.    Joni Reining, PA-C 03/03/20 1100    Jene Every, MD 03/03/20 1525

## 2020-03-03 NOTE — ED Notes (Signed)
See triage note   Presents with pain to right forearm   States he felt a pop to right f/a   Noticed bruising yesterday

## 2020-08-04 ENCOUNTER — Emergency Department: Payer: Medicaid Other

## 2020-08-04 ENCOUNTER — Other Ambulatory Visit: Payer: Self-pay

## 2020-08-04 ENCOUNTER — Emergency Department
Admission: EM | Admit: 2020-08-04 | Discharge: 2020-08-04 | Disposition: A | Discharge status: Home / Self Care | Payer: Medicaid Other | Attending: Emergency Medicine | Admitting: Emergency Medicine

## 2020-08-04 DIAGNOSIS — R1084 Generalized abdominal pain: Secondary | ICD-10-CM | POA: Diagnosis present

## 2020-08-04 DIAGNOSIS — F17210 Nicotine dependence, cigarettes, uncomplicated: Secondary | ICD-10-CM | POA: Insufficient documentation

## 2020-08-04 DIAGNOSIS — Z7982 Long term (current) use of aspirin: Secondary | ICD-10-CM | POA: Insufficient documentation

## 2020-08-04 DIAGNOSIS — K2920 Alcoholic gastritis without bleeding: Secondary | ICD-10-CM | POA: Diagnosis not present

## 2020-08-04 LAB — CBC
HCT: 48.7 % (ref 39.0–52.0)
Hemoglobin: 17.7 g/dL — ABNORMAL HIGH (ref 13.0–17.0)
MCH: 32.9 pg (ref 26.0–34.0)
MCHC: 36.3 g/dL — ABNORMAL HIGH (ref 30.0–36.0)
MCV: 90.5 fL (ref 80.0–100.0)
Platelets: 203 10*3/uL (ref 150–400)
RBC: 5.38 MIL/uL (ref 4.22–5.81)
RDW: 12.4 % (ref 11.5–15.5)
WBC: 9.9 10*3/uL (ref 4.0–10.5)
nRBC: 0 % (ref 0.0–0.2)

## 2020-08-04 LAB — COMPREHENSIVE METABOLIC PANEL
ALT: 20 U/L (ref 0–44)
AST: 24 U/L (ref 15–41)
Albumin: 4.3 g/dL (ref 3.5–5.0)
Alkaline Phosphatase: 60 U/L (ref 38–126)
Anion gap: 11 (ref 5–15)
BUN: 6 mg/dL (ref 6–20)
CO2: 25 mmol/L (ref 22–32)
Calcium: 9.4 mg/dL (ref 8.9–10.3)
Chloride: 104 mmol/L (ref 98–111)
Creatinine, Ser: 1.06 mg/dL (ref 0.61–1.24)
GFR, Estimated: >60 mL/min (ref >60)
Glucose, Bld: 99 mg/dL (ref 70–99)
Potassium: 4.8 mmol/L (ref 3.5–5.1)
Sodium: 140 mmol/L (ref 135–145)
Total Bilirubin: 0.9 mg/dL (ref 0.3–1.2)
Total Protein: 7.8 g/dL (ref 6.5–8.1)

## 2020-08-04 LAB — TYPE AND SCREEN
ABO/RH(D): O NEG
Antibody Screen: NEGATIVE

## 2020-08-04 LAB — LIPASE, BLOOD: Lipase: 28 U/L (ref 11–51)

## 2020-08-04 MED ORDER — MORPHINE SULFATE (PF) 4 MG/ML IV SOLN
4.0000 mg | Freq: Once | INTRAVENOUS | Status: AC
  Administered 2020-08-04: 4 mg via INTRAVENOUS
  Filled 2020-08-04: qty 1

## 2020-08-04 MED ORDER — IOHEXOL 300 MG/ML  SOLN
100.0000 mL | Freq: Once | INTRAMUSCULAR | Status: AC | PRN
  Administered 2020-08-04: 100 mL via INTRAVENOUS

## 2020-08-04 MED ORDER — ONDANSETRON HCL 4 MG/2ML IJ SOLN
4.0000 mg | Freq: Once | INTRAMUSCULAR | Status: AC
  Administered 2020-08-04: 4 mg via INTRAVENOUS
  Filled 2020-08-04: qty 2

## 2020-08-04 MED ORDER — LACTATED RINGERS IV BOLUS
1000.0000 mL | Freq: Once | INTRAVENOUS | Status: AC
  Administered 2020-08-04: 1000 mL via INTRAVENOUS

## 2020-08-04 MED ORDER — OMEPRAZOLE 20 MG PO CPDR: 20.0000 mg | DELAYED_RELEASE_CAPSULE | Freq: Every day | ORAL | 1 refills | Status: DC

## 2020-08-04 NOTE — ED Notes (Signed)
Pt taken to CT.

## 2020-08-04 NOTE — ED Notes (Signed)
Pt c/o constant abdominal cramping with intermittent sharp pains in the left upper abdomen. Pt denies N/V/D, reports blood in stool but not since triage assessment. Pt denies any recent diet changes, or difficulty/pain with urination. Pt is AOX4, NAD noted.

## 2020-08-04 NOTE — ED Provider Notes (Signed)
[facility] Emergency Department Provider Note   ____________________________________________   First MD Initiated Contact with Patient 08/04/20 1533     (approximate)  I have reviewed the triage vital signs and the nursing notes.   HISTORY  Chief Complaint Abdominal Pain    HPI Brett [name] is a 39 y.o. male with past medical history of alcohol abuse and bipolar disorder who presents to the ED complaining of abdominal pain.  Patient reports that he has been dealing with constant crampy pain affecting his abdomen diffusely for the past 2 days.  He will occasionally become more severe and feel "like a punch" to his abdomen.  He became more concerned today when he noticed small streaks of blood mixed with his stool as well as some blood when he went to wipe.  He has not had any fevers, nausea, vomiting, diarrhea, or constipation.  He reports dealing with similar symptoms 2 weeks ago, but at that time they resolved on their own.  He denies any prior abdominal surgical history or history of inflammatory bowel disease.  He has not had any dysuria or hematuria.        Past Medical History:  Diagnosis Date  . Depression     Patient Active Problem List   Diagnosis Date Noted  . Tobacco use disorder 01/05/2016  . Alcohol use disorder, severe, dependence (HCC) 01/05/2016  . Alcohol withdrawal (HCC) 01/05/2016  . Cannabis use disorder, moderate, dependence (HCC) 01/05/2016  . Bipolar 2 disorder, major depressive episode (HCC) 01/04/2016  . 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 Chronic prostatitis 12/22/2012  . Hernia, inguinal 12/22/2012    History reviewed. No pertinent surgical history.  Prior to Admission medications   Medication Sig Start Date End Date Taking? Authorizing Provider  aspirin EC 325 MG tablet Take 1 tablet (325 mg total) by mouth daily. 08/13/19   Sharman Cheek, MD  busPIRone (BUSPAR) 5 MG tablet Take 5 mg by  mouth 2 (two) times daily.    [provider]  diazepam (VALIUM) 5 MG tablet Take 1 tablet (5 mg total) by mouth every 8 (eight) hours as needed for muscle spasms. 12/15/19   Emily Filbert, MD  diclofenac (VOLTAREN) 75 MG EC tablet Take 75 mg by mouth 2 (two) times daily.    [provider]  fluticasone (FLONASE) 50 MCG/ACT nasal spray Place 2 sprays into both nostrils daily. 05/25/18   Menshew, Charlesetta Ivory, PA-C  omeprazole (PRILOSEC) 20 MG capsule Take 1 capsule (20 mg total) by mouth daily. 08/04/20 08/04/21  Chesley Noon, MD    Allergies Patient has no known allergies.  No family history on file.  Social History Social History   Tobacco Use  . Smoking status: Current Every Day Smoker    Packs/day: 0.50    Types: Cigarettes  . Smokeless tobacco: Never Used  Substance Use Topics  . Alcohol use: Yes  . Drug use: Yes    Review of Systems  Constitutional: No fever/chills Eyes: No visual changes. ENT: No sore throat. Cardiovascular: Denies chest pain. Respiratory: Denies shortness of breath. Gastrointestinal: Positive for abdominal pain.  No nausea, no vomiting.  No diarrhea.  No constipation.  Positive for rectal bleeding. Genitourinary: Negative for dysuria. Musculoskeletal: Negative for back pain. Skin: Negative for rash. Neurological: Negative for headaches, focal weakness or numbness.  ____________________________________________   PHYSICAL EXAM:  VITAL SIGNS: ED Triage Vitals  Enc Vitals Group     BP 08/04/20 2423 Marland Kitchen)  144/92     Pulse Rate 08/04/20 0952 91     Resp 08/04/20 0952 18     Temp 08/04/20 1511 97.7 F (36.5 C)     Temp Source 08/04/20 0952 Oral     SpO2 08/04/20 0952 99 %     Weight 08/04/20 0952 185 lb (83.9 kg)     Height 08/04/20 0952 5\' 10"  (1.778 m)     Head Circumference --      Peak Flow --      Pain Score 08/04/20 0952 8     Pain Loc --      Pain Edu? --      Excl. in GC? --     Constitutional: Alert and  oriented. Eyes: Conjunctivae are normal. Head: Atraumatic. Nose: No congestion/rhinnorhea. Mouth/Throat: Mucous membranes are moist. Neck: Normal ROM Cardiovascular: Normal rate, regular rhythm. Grossly normal heart sounds. Respiratory: Normal respiratory effort.  No retractions. Lungs CTAB. Gastrointestinal: Soft and diffusely tender to palpation with no rebound or guarding, greatest tenderness in the left lower quadrant. No distention. Genitourinary: deferred Musculoskeletal: No lower extremity tenderness nor edema. Neurologic:  Normal speech and language. No gross focal neurologic deficits are appreciated. Skin:  Skin is warm, dry and intact. No rash noted. Psychiatric: Mood and affect are normal. Speech and behavior are normal.  ____________________________________________   LABS (all labs ordered are listed, but only abnormal results are displayed)  Labs Reviewed  CBC - Abnormal; Notable for the following components:      Result Value   Hemoglobin 17.7 (*)    MCHC 36.3 (*)    All other components within normal limits  COMPREHENSIVE METABOLIC PANEL  LIPASE, BLOOD  TYPE AND SCREEN    PROCEDURES  Procedure(s) performed (including Critical Care):  Procedures   ____________________________________________   INITIAL IMPRESSION / ASSESSMENT AND PLAN / ED COURSE       39 year old male with past medical history of alcohol abuse and bipolar disorder presents to the ED complaining of diffuse abdominal pain for the past 2 days associated with some rectal bleeding.  He describes the blood in his stool as small streaks and primarily noted when he goes to wipe.  This sounds most consistent with mild rectal bleeding and patient declines rectal exam at this time.  We will further assess his pain with CT scan but lab work is thus far reassuring.  His H&H is stable, lipase and LFTs within normal limits.  Differential includes infectious colitis, inflammatory bowel disease,  diverticulitis, appendicitis.  He denies any urinary symptoms to suggest UTI.  We will treat symptomatically with IV morphine and Zofran.  On reassessment, patient reports pain is improved.  CT scan is unremarkable and there is no evidence of diverticulitis, appendicitis, or other acute process.  He does admit that he drinks alcohol on a regular basis and he could have some alcoholic gastritis versus peptic ulcer disease.  We will start patient on omeprazole and he was provided referral to follow-up with PCP.  He was counseled to cut back on his alcohol intake and avoid NSAIDs.  He was counseled to return to the ED for new worsening symptoms, patient agrees with plan.      ____________________________________________   FINAL CLINICAL IMPRESSION(S) / ED DIAGNOSES  Final diagnoses:  Generalized abdominal pain  Acute alcoholic gastritis without hemorrhage     ED Discharge Orders         Ordered    omeprazole (PRILOSEC) 20 MG capsule  Daily  08/04/20 1700           Note:  This document was prepared using Dragon voice recognition software and may include unintentional dictation errors.   Chesley Noon, MD 08/04/20 [phone]

## 2020-08-04 NOTE — ED Triage Notes (Signed)
Pt c/o abd pain for the past 2 days and this morning started passing bright red blood with his stool, states he is not having any diarrhea, denies N/V.Marland Kitchen

## 2020-10-10 ENCOUNTER — Emergency Department
Admission: EM | Admit: 2020-10-10 | Discharge: 2020-10-10 | Disposition: A | Discharge status: Home / Self Care | Payer: Medicaid Other | Attending: Emergency Medicine | Admitting: Emergency Medicine

## 2020-10-10 ENCOUNTER — Encounter: Payer: Self-pay | Admitting: Emergency Medicine

## 2020-10-10 ENCOUNTER — Other Ambulatory Visit: Payer: Self-pay

## 2020-10-10 DIAGNOSIS — R070 Pain in throat: Secondary | ICD-10-CM | POA: Diagnosis present

## 2020-10-10 DIAGNOSIS — Z20822 Contact with and (suspected) exposure to covid-19: Secondary | ICD-10-CM | POA: Diagnosis not present

## 2020-10-10 DIAGNOSIS — J069 Acute upper respiratory infection, unspecified: Secondary | ICD-10-CM | POA: Diagnosis not present

## 2020-10-10 DIAGNOSIS — F17210 Nicotine dependence, cigarettes, uncomplicated: Secondary | ICD-10-CM | POA: Insufficient documentation

## 2020-10-10 LAB — RESP PANEL BY RT-PCR (FLU A&B, COVID) ARPGX2
Influenza A by PCR: NEGATIVE
Influenza B by PCR: NEGATIVE
SARS Coronavirus 2 by RT PCR: NEGATIVE

## 2020-10-10 LAB — GROUP A STREP BY PCR: Group A Strep by PCR: NOT DETECTED

## 2020-10-10 MED ORDER — PREDNISONE 10 MG PO TABS: ORAL_TABLET | ORAL | 0 refills | Status: DC

## 2020-10-10 MED ORDER — IBUPROFEN 800 MG PO TABS: 800.0000 mg | ORAL_TABLET | Freq: Three times a day (TID) | ORAL | 0 refills | Status: DC | PRN

## 2020-10-10 NOTE — ED Notes (Signed)
Assessed by provider prior to d/c 

## 2020-10-10 NOTE — ED Triage Notes (Signed)
Pt in with co sore throat, cough and congestion.

## 2020-10-10 NOTE — ED Provider Notes (Signed)
[facility] Emergency Department Provider Note  ____________________________________________   Event Date/Time   First MD Initiated Contact with Patient 10/10/20 [phone]     (approximate)  I have reviewed the triage vital signs and the nursing notes.   HISTORY  Chief Complaint Nasal Congestion   HPI Brett [name] is a 39 y.o. male presents to the ED with complaint of sore throat, cough, and congestion that started just recently.  Patient is unaware of any known Covid exposure.  Patient admits to smoking cigarettes, he denies any known history of bronchitis or pneumonia.  Patient rates his pain as 5 out of 10.        Past Medical History:  Diagnosis Date  . Depression     Patient Active Problem List   Diagnosis Date Noted  . Tobacco use disorder 01/05/2016  . Alcohol use disorder, severe, dependence (HCC) 01/05/2016  . Alcohol withdrawal (HCC) 01/05/2016  . Cannabis use disorder, moderate, dependence (HCC) 01/05/2016  . Bipolar 2 disorder, major depressive episode (HCC) 01/04/2016  . 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 Chronic prostatitis 12/22/2012  . Hernia, inguinal 12/22/2012    History reviewed. No pertinent surgical history.  Prior to Admission medications   Medication Sig Start Date End Date Taking? Authorizing Provider  ibuprofen (ADVIL) 800 MG tablet Take 1 tablet (800 mg total) by mouth every 8 (eight) hours as needed. 10/10/20  Yes Tommi Rumps, PA-C  predniSONE (DELTASONE) 10 MG tablet Take 3 tablets once a day for 4 days 10/10/20  Yes Tommi Rumps, PA-C  aspirin EC 325 MG tablet Take 1 tablet (325 mg total) by mouth daily. 08/13/19   Sharman Cheek, MD  busPIRone (BUSPAR) 5 MG tablet Take 5 mg by mouth 2 (two) times daily.    [provider]  diazepam (VALIUM) 5 MG tablet Take 1 tablet (5 mg total) by mouth every 8 (eight) hours as needed for muscle spasms. 12/15/19   Emily Filbert,  MD  diclofenac (VOLTAREN) 75 MG EC tablet Take 75 mg by mouth 2 (two) times daily.    [provider]  fluticasone (FLONASE) 50 MCG/ACT nasal spray Place 2 sprays into both nostrils daily. 05/25/18   Menshew, Charlesetta Ivory, PA-C  omeprazole (PRILOSEC) 20 MG capsule Take 1 capsule (20 mg total) by mouth daily. 08/04/20 08/04/21  Chesley Noon, MD    Allergies Patient has no known allergies.  History reviewed. No pertinent family history.  Social History Social History   Tobacco Use  . Smoking status: Current Every Day Smoker    Packs/day: 0.50    Types: Cigarettes  . Smokeless tobacco: Never Used  Substance Use Topics  . Alcohol use: Yes  . Drug use: Yes    Review of Systems Constitutional: No fever/chills Eyes: No visual changes. ENT: Positive sore throat.  Positive for nasal congestion. Cardiovascular: Denies chest pain. Respiratory: Denies shortness of breath.  Positive for cough. Gastrointestinal: No abdominal pain.  No nausea, no vomiting.  No diarrhea.  Musculoskeletal: Positive for body aches. Skin: Negative for rash. Neurological: Negative for headaches, focal weakness or numbness. ____________________________________________   PHYSICAL EXAM:  VITAL SIGNS: ED Triage Vitals  Enc Vitals Group     BP 10/10/20 0631 (!) 141/98     Pulse Rate 10/10/20 0629 83     Resp 10/10/20 0629 20     Temp 10/10/20 0629 98.6 F (37 C)     Temp Source 10/10/20 0629 Oral  SpO2 10/10/20 0629 97 %     Weight 10/10/20 0629 195 lb (88.5 kg)     Height 10/10/20 0629 5\' 10"  (1.778 m)     Head Circumference --      Peak Flow --      Pain Score 10/10/20 0629 5     Pain Loc --      Pain Edu? --      Excl. in GC? --     Constitutional: Alert and oriented. Well appearing and in no acute distress. Eyes: Conjunctivae are normal.  Head: Atraumatic. Nose: Mild congestion/rhinnorhea. Mouth/Throat: Mucous membranes are moist.  Oropharynx non-erythematous.  No  exudate. Neck: No stridor.   Hematological/Lymphatic/Immunilogical: No cervical lymphadenopathy. Cardiovascular: Normal rate, regular rhythm. Grossly normal heart sounds.  Good peripheral circulation. Respiratory: Normal respiratory effort.  No retractions. Lungs CTAB. Gastrointestinal: Soft and nontender. No distention.  Musculoskeletal: Moves upper and lower extremities with any difficulty normal gait was noted. Neurologic:  Normal speech and language. No gross focal neurologic deficits are appreciated.  Skin:  Skin is warm, dry and intact. No rash noted. Psychiatric: Mood and affect are normal. Speech and behavior are normal.  ____________________________________________   LABS (all labs ordered are listed, but only abnormal results are displayed)  Labs Reviewed  GROUP A STREP BY PCR  RESP PANEL BY RT-PCR (FLU A&B, COVID) [id number]    PROCEDURES  Procedure(s) performed (including Critical Care):  Procedures   ____________________________________________   INITIAL IMPRESSION / ASSESSMENT AND PLAN / ED COURSE  As part of my medical decision making, I reviewed the following data within the electronic MEDICAL RECORD NUMBER Notes from prior ED visits and [place] Controlled Substance Database  39 year old male presents to the ED with complaint of cough, congestion, sore throat.  Patient is unaware of any exposure to Covid and states no one else in his family is sick.  Covid, influenza and strep test were all negative.  Patient was made aware.  He will continue taking Delsym over-the-counter for his cough.  Patient is aware that most likely this is a viral illness.  A prescription for ibuprofen 800 mg was sent to his pharmacy along with a prednisone 30 mg  for the next 4 days.  Increase fluids.  And follow-up with his PCP if any continued problems.  He was also given a note to remain out of work for the next 2 days.  ____________________________________________   FINAL CLINICAL IMPRESSION(S) /  ED DIAGNOSES  Final diagnoses:  Viral URI with cough     ED Discharge Orders         Ordered    ibuprofen (ADVIL) 800 MG tablet  Every 8 hours PRN        10/10/20 0801    predniSONE (DELTASONE) 10 MG tablet        10/10/20 0801          *Please note:  Chanz Cahall Hogate was evaluated in Emergency Department on 10/10/2020 for the symptoms described in the history of present illness. He was evaluated in the context of the global COVID-19 pandemic, which necessitated consideration that the patient might be at risk for infection with the SARS-CoV-2 virus that causes COVID-19. Institutional protocols and algorithms that pertain to the evaluation of patients at risk for COVID-19 are in a state of rapid change based on information released by regulatory bodies including the CDC and federal and state organizations. These policies and algorithms were followed during the patient's care in the ED.  Some  ED evaluations and interventions may be delayed as a result of limited staffing during and the pandemic.*   Note:  This document was prepared using Dragon voice recognition software and may include unintentional dictation errors.    Tommi Rumps, PA-C 10/10/20 1038    Minna Antis, MD 10/10/20 1234

## 2020-10-10 NOTE — Discharge Instructions (Signed)
Follow-up with your primary care provider or [facility] acute care if any continued problems.  Increase fluids.  A prescription for ibuprofen was sent to your pharmacy to take 3 times a day as needed for inflammation, body aches or fever.  Make sure that you are eating well and taking this medication.  Also prescription for prednisone 3 tablets once a day for the next 4 days will help with throat irritation and drainage.  Also there is Flonase over-the-counter which she can obtain which will help with nasal congestion and posterior drainage.

## 2022-07-23 IMAGING — CT CT ABD-PELV W/ CM
2 of 4 series · 16 of 46 positions shown, 18 images · IV contrast (omnipaque)
Comparison: 12/15/2019

CLINICAL DATA: Bright red blood per rectum with lower abdominal
pain for 2 days, initial encounter

EXAM:
CT ABDOMEN AND PELVIS WITH CONTRAST
TECHNIQUE: Multidetector CT imaging of the abdomen and pelvis was performed
using the standard protocol following bolus administration of
intravenous contrast.
CONTRAST:  100mL OMNIPAQUE IOHEXOL 300 MG/ML  SOLN

[Series 2: routine abd/pel with · axial · 0.81mm/px · z∈[-1080,-670]mm · 13 of 90 slices shown, 15 images]
[im 4/90  soft-tissue]
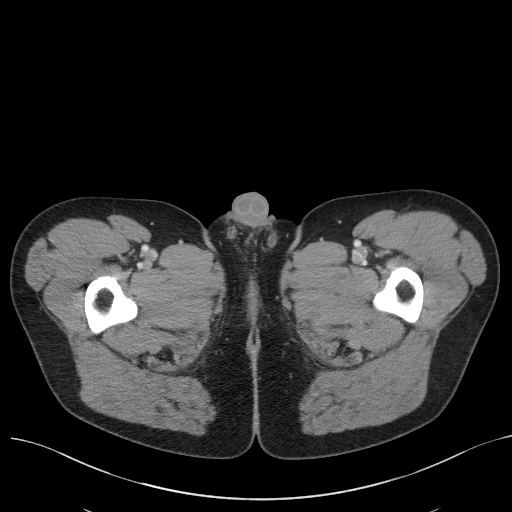
[im 4/90  bone]
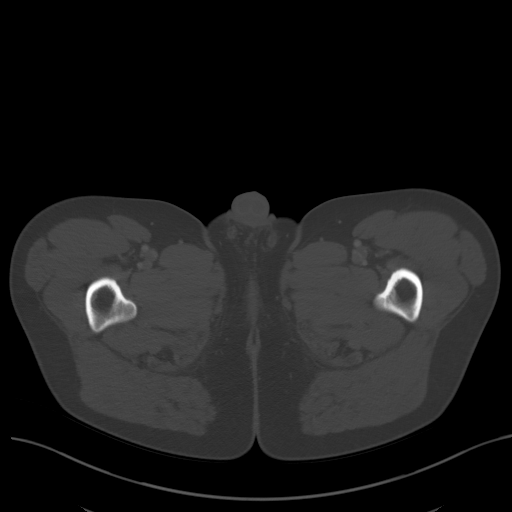
[im 11/90  soft-tissue]
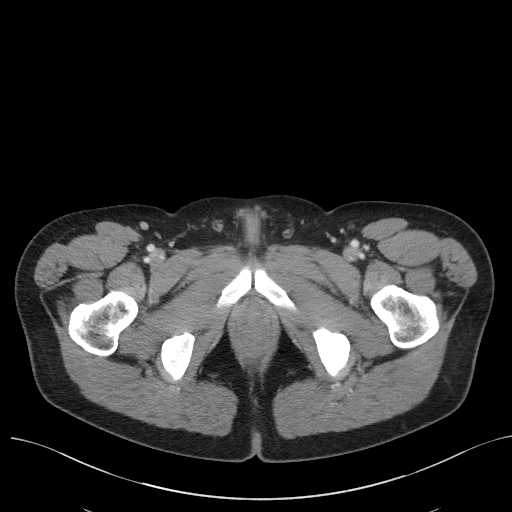
[im 18/90  soft-tissue]
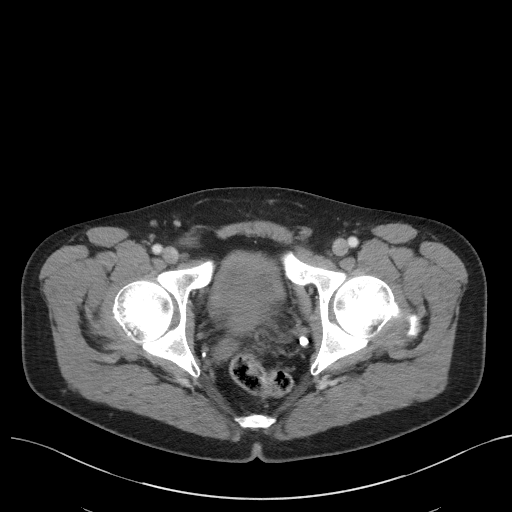
[im 24/90  soft-tissue]
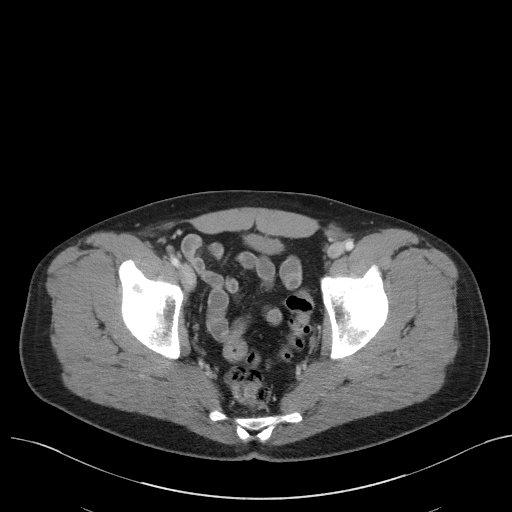
[im 31/90  soft-tissue]
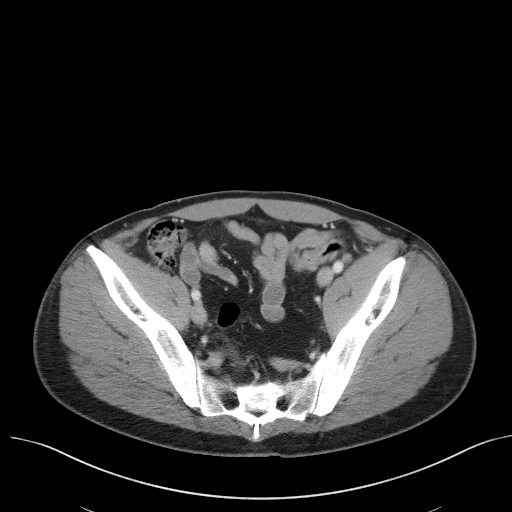
[im 38/90  soft-tissue]
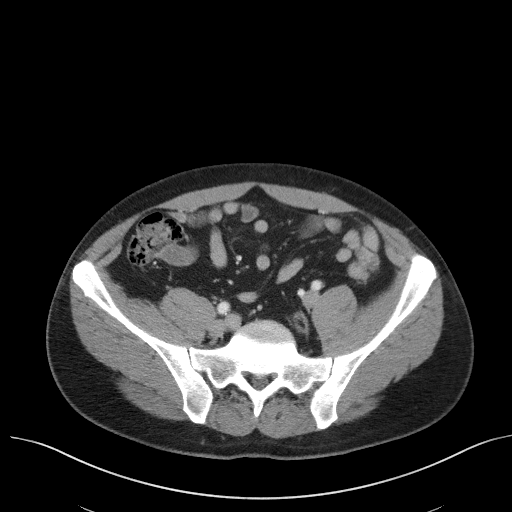
[im 45/90  soft-tissue]
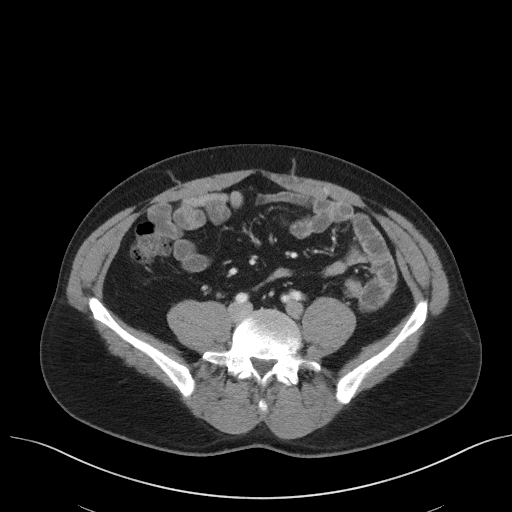
[im 52/90  soft-tissue]
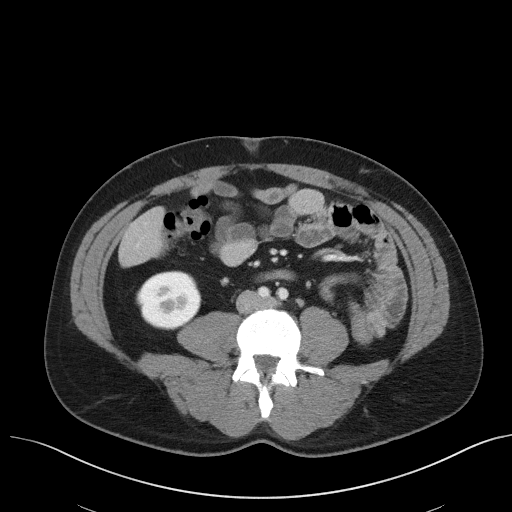
[im 59/90  soft-tissue]
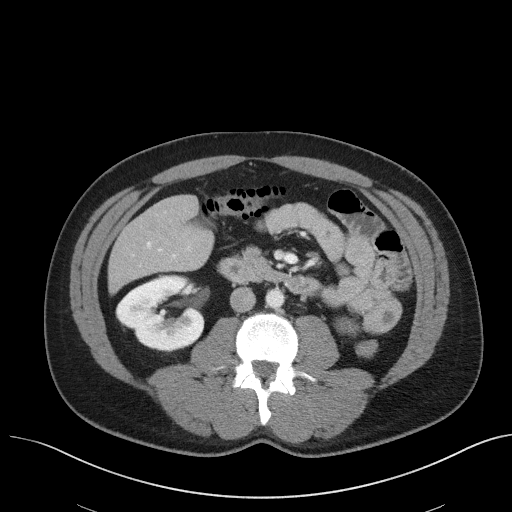
[im 59/90  bone]
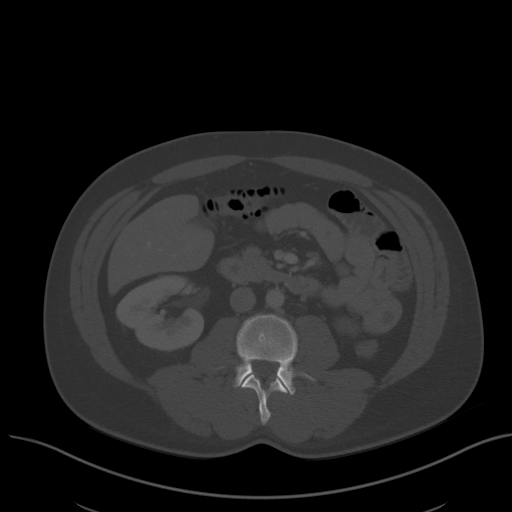
[im 66/90  soft-tissue]
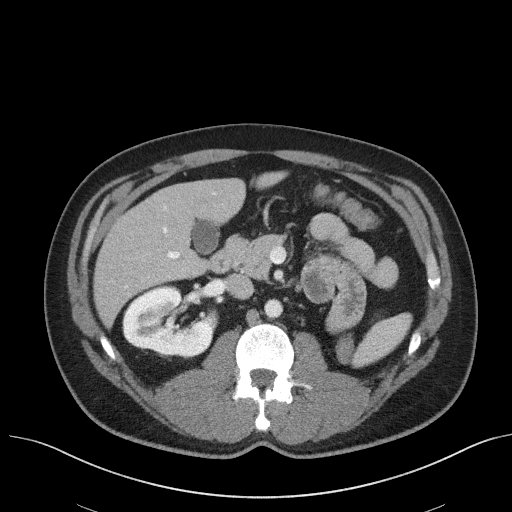
[im 72/90  soft-tissue]
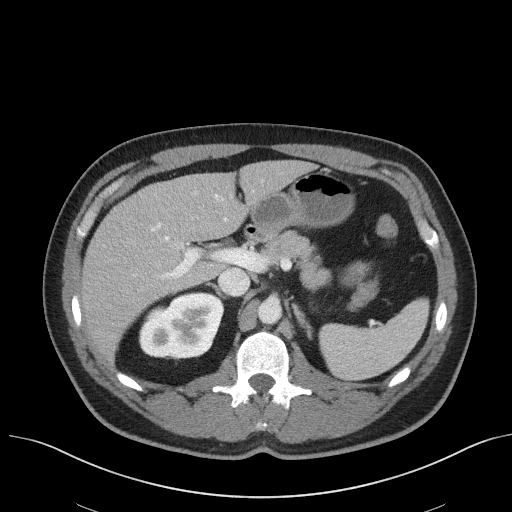
[im 79/90  soft-tissue]
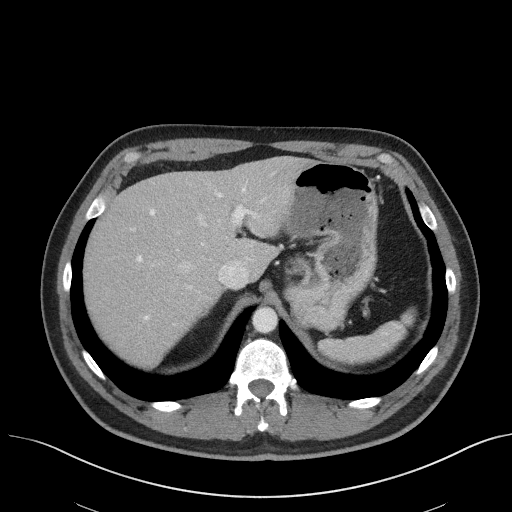
[im 86/90  soft-tissue]
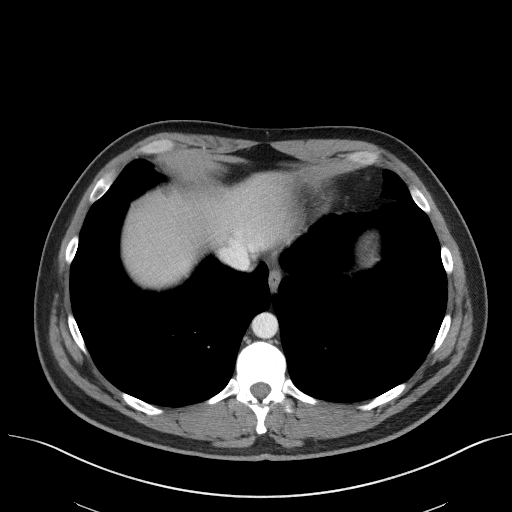

[Series 5: coronal st · coronal · 0.77mm/px · 3 of 93 slices shown]
[im 31/93  soft-tissue]
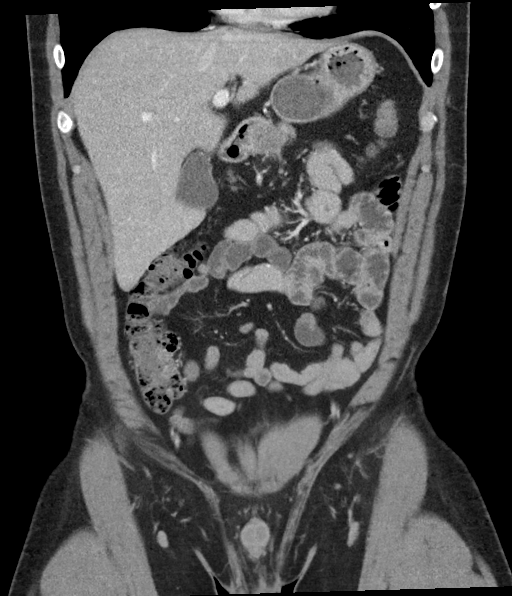
[im 41/93  soft-tissue]
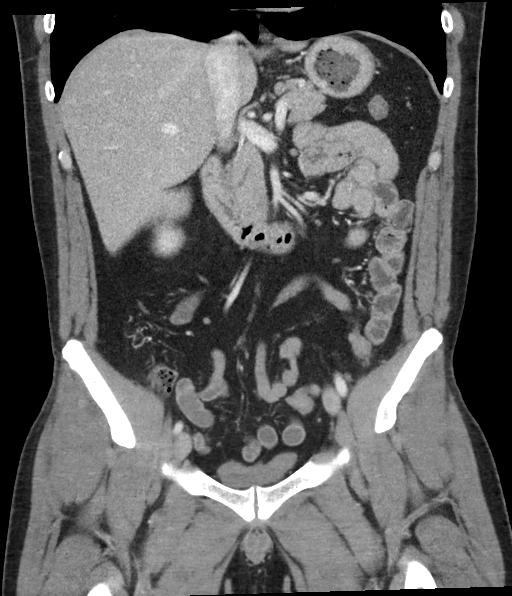
[im 52/93  soft-tissue]
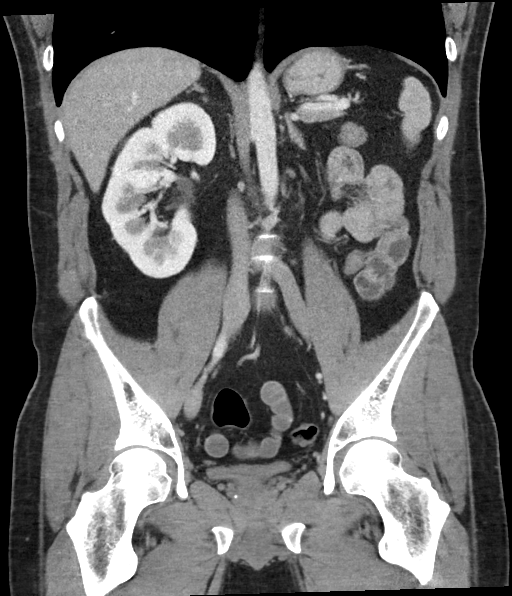

[16 of 46 positions shown; findings below may reference images not displayed]

FINDINGS: Lower chest: No acute abnormality.

Hepatobiliary: Mild fatty infiltration of the liver is noted. The
gallbladder is within normal limits.

Pancreas: Unremarkable. No pancreatic ductal dilatation or
surrounding inflammatory changes.

Spleen: Normal in size without focal abnormality.

Adrenals/Urinary Tract: Adrenal glands are within normal limits.
Right kidney is well visualized within normal enhancement pattern.
No renal calculi or obstructive changes are seen. The bladder is
decompressed. Left kidney is congenitally absent stable from the
prior exam.

Stomach/Bowel: Scattered diverticular change of the colon is noted.
No evidence of diverticulitis is seen. The appendix is well
visualized and within normal limits. Small bowel and stomach are
unremarkable.

Vascular/Lymphatic: Aortic atherosclerosis. No enlarged abdominal or
pelvic lymph nodes.

Reproductive: Prostate is unremarkable.

Other: No abdominal wall hernia or abnormality. No abdominopelvic
ascites.

Musculoskeletal: No acute or significant osseous findings.
IMPRESSION: Mild diverticulosis without evidence of diverticulitis.

Congenital absence of the left kidney.

Mild fatty infiltration of the liver.

## 2024-04-26 ENCOUNTER — Telehealth: Payer: Self-pay

## 2024-04-26 ENCOUNTER — Ambulatory Visit (INDEPENDENT_AMBULATORY_CARE_PROVIDER_SITE_OTHER)

## 2024-04-26 VITALS — BP 138/88 | HR 90 | Ht 71.0 in | Wt 205.0 lb

## 2024-04-26 DIAGNOSIS — Q605 Renal hypoplasia, unspecified: Secondary | ICD-10-CM

## 2024-04-26 DIAGNOSIS — B009 Herpesviral infection, unspecified: Secondary | ICD-10-CM | POA: Insufficient documentation

## 2024-04-26 DIAGNOSIS — F17200 Nicotine dependence, unspecified, uncomplicated: Secondary | ICD-10-CM | POA: Diagnosis not present

## 2024-04-26 DIAGNOSIS — K760 Fatty (change of) liver, not elsewhere classified: Secondary | ICD-10-CM | POA: Insufficient documentation

## 2024-04-26 DIAGNOSIS — N411 Chronic prostatitis: Secondary | ICD-10-CM

## 2024-04-26 DIAGNOSIS — Q602 Renal agenesis, unspecified: Secondary | ICD-10-CM | POA: Diagnosis not present

## 2024-04-26 DIAGNOSIS — F3160 Bipolar disorder, current episode mixed, unspecified: Secondary | ICD-10-CM | POA: Insufficient documentation

## 2024-04-26 DIAGNOSIS — F419 Anxiety disorder, unspecified: Secondary | ICD-10-CM

## 2024-04-26 MED ORDER — VALACYCLOVIR HCL 1 G PO TABS: 1000.0000 mg | ORAL_TABLET | Freq: Every day | ORAL | 1 refills | Status: AC

## 2024-04-26 NOTE — Progress Notes (Signed)
 Patient: Brett [name] MRN: [id number] DOB: 1981-08-16 PCP: Patient, No Pcp Per     Subjective:  Chief Complaint  Patient presents with   Establish Care   Medical Management of Chronic Issues    HPI: The patient is a 43 y.o. male who presents today for Patient is seen to establish care.  He has a more recent diagnosis of genital appeased.  He reports being diagnosed in urgent care but was not given treatment.  Patient Reports burning pain in the groin and inguinal region followed by outbreak.  He does report leg diagnosis in urgent care as well.  Patient history of chronic prostatitis.  He has been seen by other physicians for this in the past.  Some increased urinary frequency and pelvic pressure.  Patient was treated with a course of ciprofloxacin.  He completed of course of 500 mg twice daily x 14 days.  Since diagnosis with chronic chronic prostatitis he does have more fatigue.  Patient is a current s He denies pelvic pain or other symptoms.  No fever or chills.  Previous U/A unremarkable.  Further imaging may be indicated if symptoms persist.   Patient has a history of heavier alcohol use but is in remission.  He does not drink currently  Smoker smoking about half a pack a day.  He has more than 25-year smoking history.  Patient does exercise on a regular basis.  He feels that his working hours are adequate.  He has a past imaging of fatty liver on the CT of the abdominal done in 2021.  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  Patient history of anxiety treated with medication in the past but feels that this is manageable currently.  Patient reports diet is generally healthy.  Family history notable for coronary artery disease in his mother.  Sexually active  currently.  Patient does not receive vaccines typically.  Review of Systems  Constitutional:  Negative for chills, fever and weight loss.  Eyes:  Negative for blurred vision.   Respiratory:  Negative for cough and shortness of breath.   Cardiovascular:  Negative for chest pain and palpitations.  Skin:  Negative for rash.  Psychiatric/Behavioral:  Negative for depression. The patient is not nervous/anxious.     Objective: There were no vitals filed for this visit.  There is no height or weight on file to calculate BMI.  Physical Exam    Physical Exam Vitals reviewed.  Constitutional:      Appearance: Normal appearance. Well-developed with normal weight.  HENT:     Head: Normocephalic and atraumatic.  Normal mucous membranes, no oral lesions Eyes:     Pupils: Pupils are equal, round, and reactive to light.  Neck:     Thyroid: No thyroid mass or thyromegaly.  Cardiovascular:     Rate and Rhythm: Normal rate and regular rhythm. Normal heart sounds. Normal peripheral pulses Pulmonary:     Normal breath sounds with normal effort Abdominal:   Abdomen is soft, without tenderness or noted hepatosplenomegaly Musculoskeletal:        General: No swelling or edema  Lymphadenopathy:     Cervical: No cervical adenopathy.  Skin:    General: Skin is warm and dry without noticeable rash. Neurological:     General: No focal deficit present.  Psychiatric:        Mood and Affect: Mood, behavior and cognition normal  Assessment/plan:  #1 chronic prostatitis.  It seems that heHe has recurrent symptoms  similar to what he had previously.  He did have 2 weeks of therapy which improved symptoms which then relapsed.  It is not certain whether we have continued infection since this is a recurrent issue.  Will recheck urinalysis. Urology to see due to residual symptoms  2.  Genital herpes.  Will provide valacyclovir  1000 mg daily for 5 days to use on a as needed basis.  If this is ineffective or we have recurrent outbreaks we may need suppressive regime.  He does report that his stress level is higher more recently which may be a contributing factor.  3.  Unilateral  kidney.  Patient needs ongoing evaluation of kidney function.  We will order basic labs.  Also important that he has adequate treatment of possible infection  4.  Current smoker.  Patient does qualify for low-dose screening CT.  Will order if so.  5.  History of fatty liver.  He should have follow-up ultrasound for this issue.  We will check lipids including LPA.  6.  Anxiety.  Patient has anxiety well-controlled without medication.  But we did discuss his psychiatric history and it seems that he had an erroneous diagnosis of bipolar in the past.  He has had no symptoms of bipolar neither severe depression or mania episodes.  Plan to follow-up with this patient with labs in 4 weeks.  Allergies Patient has no known allergies.  Past Medical History Patient  has a past medical history of Depression (When I was 23).  Surgical History Patient  has no past surgical history on file.  Family History Pateint's family history is not on file.  Social History Patient  reports that he has been smoking cigarettes. He has a 7.5 pack-year smoking history. He has never used smokeless tobacco. He reports that he does not currently use alcohol. He reports that he does not currently use drugs.    04/26/2024

## 2024-04-26 NOTE — Telephone Encounter (Signed)
 Mychart message sent to patient.

## 2024-05-19 ENCOUNTER — Other Ambulatory Visit: Payer: Self-pay

## 2024-05-19 DIAGNOSIS — N411 Chronic prostatitis: Secondary | ICD-10-CM

## 2024-05-20 ENCOUNTER — Ambulatory Visit (INDEPENDENT_AMBULATORY_CARE_PROVIDER_SITE_OTHER): Admitting: Urology

## 2024-05-20 ENCOUNTER — Encounter: Payer: Self-pay | Admitting: Urology

## 2024-05-20 ENCOUNTER — Other Ambulatory Visit
Admission: RE | Admit: 2024-05-20 | Discharge: 2024-05-20 | Disposition: A | Discharge status: Home / Self Care | Attending: Urology | Admitting: Urology

## 2024-05-20 VITALS — BP 141/89 | HR 76 | Ht 71.0 in | Wt 209.0 lb

## 2024-05-20 DIAGNOSIS — R102 Pelvic and perineal pain: Secondary | ICD-10-CM | POA: Diagnosis not present

## 2024-05-20 DIAGNOSIS — Z3009 Encounter for other general counseling and advice on contraception: Secondary | ICD-10-CM

## 2024-05-20 DIAGNOSIS — Z125 Encounter for screening for malignant neoplasm of prostate: Secondary | ICD-10-CM | POA: Diagnosis not present

## 2024-05-20 DIAGNOSIS — N411 Chronic prostatitis: Secondary | ICD-10-CM

## 2024-05-20 DIAGNOSIS — R399 Unspecified symptoms and signs involving the genitourinary system: Secondary | ICD-10-CM

## 2024-05-20 LAB — URINALYSIS, COMPLETE (UACMP) WITH MICROSCOPIC
Bilirubin Urine: NEGATIVE
Glucose, UA: NEGATIVE mg/dL
Hgb urine dipstick: NEGATIVE
Ketones, ur: NEGATIVE mg/dL
Leukocytes,Ua: NEGATIVE
Nitrite: NEGATIVE
Specific Gravity, Urine: >1.03 — ABNORMAL HIGH (ref 1.005–1.030)
pH: 5.5 (ref 5.0–8.0)

## 2024-05-20 MED ORDER — DIAZEPAM 10 MG PO TABS: 10.0000 mg | ORAL_TABLET | Freq: Once | ORAL | 0 refills | Status: DC | PRN

## 2024-05-20 MED ORDER — CELECOXIB 200 MG PO CAPS: 200.0000 mg | ORAL_CAPSULE | Freq: Two times a day (BID) | ORAL | 0 refills | Status: AC

## 2024-05-20 NOTE — Progress Notes (Signed)
 05/20/24 8:36 AM   Brett [name] [birth year] [id number]  CC: Pelvic pain, urinary symptoms, solitary right kidney, discussed vasectomy, questions about PSA screening  HPI: 43 year old male previously followed by Dr. Ike in 2015 here today to discuss the above issues.  He has a distant history of chronic prostatitis that previously was treated with Flomax and pelvic floor PT.  He feels like his symptoms have returned over the last few months with pelvic pressure, worse with constipation, some weak urinary stream.  He feels his symptoms are worse with stress.  He was treated with antibiotics from urgent care with minimal improvement.  He denies any dysuria or gross hematuria.    Urinalysis today is benign.  He has 3 children, not interested in further biologic pregnancies.  Congenital solitary right kidney.  He also has questions about PSA screening  PMH: Past Medical History:  Diagnosis Date   Alcohol use disorder, severe, dependence (HCC) 01/05/2016   Anxiety    Depression When I was 23   don't take medication   Hernia, inguinal 12/22/2012   Orchalgia 10/29/2013      Social History:  reports that he has been smoking cigarettes. He has a 7.5 pack-year smoking history. He has never used smokeless tobacco. He reports that he does not currently use alcohol. He reports that he does not currently use drugs after having used the following drugs: Marijuana.  Physical Exam: BP (!) 141/89   Pulse 76   Ht 5' 11 (1.803 m)   Wt 209 lb (94.8 kg)   BMI 29.15 kg/m    Constitutional:  Alert and oriented, No acute distress. Cardiovascular: No clubbing, cyanosis, or edema. Respiratory: Normal respiratory effort, no increased work of breathing. GI: Abdomen is soft, nontender, nondistended, no abdominal masses GU: Testicles 20 cc and descended bilaterally without masses, small 3 mm skin tag on dorsal penile shaft, right vas deferens easily palpable, left vas deferens not definitively  palpable and may be atretic in setting of solitary kidney  Laboratory Data: Reviewed, see HPI  Pertinent Imaging: I have personally viewed and interpreted the CT abdomen and pelvis from 2021 showing a solitary right kidney.  Assessment & Plan:   43 year old male with solitary congenital right kidney, desire for vasectomy, and history of pelvic floor dysfunction/chronic prostatitis with flare over the last 2 months.  He also had questions about PSA screening, and the guidelines were reviewed.  We discussed the complexities of pelvic pain and possible range of etiologies including pelvic floor dysfunction, chronic bladder pain syndrome, and interstitial cystitis.  We reviewed the AUA guidelines that recommend an algorithmic approach to treatment for these patients, and that a trial of different medications and strategies is sometimes needed to find the approach that works best for each patient's unique situation.  I reinforced the importance of stress management, relaxation, avoiding triggers, and pain management in the approach to pelvic pain.  We discussed the risks and benefits of vasectomy at length.  Vasectomy is intended to be a permanent form of contraception, and does not produce immediate sterility.  Following vasectomy another form of contraception is required until vas occlusion is confirmed by a post-vasectomy semen analysis obtained 2-3 months after the procedure.  Even after vas occlusion is confirmed, vasectomy is not 100% reliable in preventing pregnancy, and the failure rate is approximately 10/1998.  Repeat vasectomy is required in less than 1% of patients.  He should refrain from ejaculation for 1 week after vasectomy.  Options for fertility after  vasectomy include vasectomy reversal, and sperm retrieval with in vitro fertilization or ICSI.  These options are not always successful and may be expensive.  Finally, there are other permanent and non-permanent alternatives to vasectomy  available. There is no risk of erectile dysfunction, and the volume of semen will be similar to prior, as the majority of the ejaculate is from the prostate and seminal vesicles. The procedure takes ~20 minutes.  We recommend patients take 5-10 mg of Valium  30 minutes prior, and he will need a driver post-procedure.  Local anesthetic is injected into the scrotal skin and a small segment of the vas deferens is removed, and the ends occluded. The complication rate is approximately 1-2%, and includes bleeding, infection, and development of chronic scrotal pain.  PLAN:  -Schedule vasectomy and skin tag removal, Valium  sent to pharmacy -Trial of Celebrex  and pelvic floor stretching exercises for pelvic floor dysfunction, behavioral/diet strategies reviewed -Can check PSA sometime in the next few months to initiate screening   I spent 65 total minutes on the day of the encounter including pre-visit review of the medical record, face-to-face time with the patient, and post visit ordering of labs/imaging/tests.  Redell Burnet, MD 05/20/2024  [facility] [address] [place], [place] [address] [phone]

## 2024-05-20 NOTE — Patient Instructions (Signed)
 Pelvic Floor Dysfunction, Male     Pelvic floor dysfunction (PFD) is a condition that results when the group of muscles and connective tissues that support the organs in the pelvis (pelvic floor muscles) do not work well. These muscles and their connections form a sling that supports the colon and bladder. In men, these muscles also support the prostate gland. PFD causes pelvic floor muscles to be too weak, too tight, or both. In PFD, muscle movements are not coordinated. This may cause bowel or bladder problems. It may also cause pain. What are the causes? This condition may be caused by an injury to the pelvic area or by a weakening of pelvic muscles. In many cases, the exact cause is not known. What increases the risk? The following factors may make you more likely to develop PFD: Having chronic bladder tissue inflammation (interstitial cystitis). Being an older person. Being overweight. History of radiation treatment for cancer in the pelvic region. Previous pelvic surgery, such as removal of the prostate gland (prostatectomy). What are the signs or symptoms? Symptoms of this condition vary and may include: Bladder symptoms, such as: Trouble starting urination and emptying the bladder. Frequent urinary tract infections. Leaking urine when coughing, laughing, or exercising (stress incontinence). Having to pass urine urgently or frequently. Pain when passing urine. Bowel symptoms, such as: Constipation. Urgent or frequent bowel movements. Incomplete bowel movements. Painful bowel movements. Leaking stool or gas. Unexplained genital or rectal pain. Genital or rectal muscle spasms. Low back pain. Sexual dysfunction, such as erectile dysfunction, premature ejaculation, or pain during or after sexual activity. How is this diagnosed? This condition is diagnosed based on: Your symptoms and medical history. A physical exam. During the exam, your health care provider may check your  pelvic muscles for tightness, spasm, pain, or weakness. This may include a rectal exam. In some cases, you may have diagnostic tests, such as: Electrical muscle function tests. Urine flow testing. X-ray tests of bowel function. Ultrasound of the pelvic organs. How is this treated? Treatment for this condition depends on your symptoms. Treatment options include: Physical therapy. This may include Kegel exercises to help relax or strengthen the pelvic floor muscles. Biofeedback. This type of therapy provides feedback on how tight your pelvic floor muscles are so that you can learn to control them. Massage therapy. A treatment that involves electrical stimulation of the pelvic floor muscles to help control pain (transcutaneous electrical nerve stimulation, or TENS). Sound wave therapy (ultrasound) to reduce muscle spasms. Medicines, such as: Muscle relaxants. Bladder control medicines. Surgery to reconstruct or support pelvic floor muscles may be an option if other treatments do not help. Follow these instructions at home: Activity Do your usual activities as told by your health care provider. Ask your health care provider if you should modify any activities. Do pelvic floor strengthening or relaxing exercises at home as told by your physical therapist. Lifestyle Maintain a healthy weight. Eat foods that are high in fiber, such as beans, whole grains, and fresh fruits and vegetables. Limit foods that are high in fat and processed sugars, such as fried or sweet foods. Manage stress with relaxation techniques such as yoga or meditation. General instructions If you have problems with leakage: Use absorbable pads or wear padded underwear. Wash your genital and anal area frequently with mild soap. Keep your genital and anal area as clean and dry as possible. Ask your health care provider if you should try a barrier cream to prevent skin irritation. Take warm  baths to relieve pelvic muscle  tension or spasms. Take over-the-counter and prescription medicines only as told by your health care provider. Keep all follow-up visits. How is this prevented? The cause of PFD is not always known, but there are a few things you can do to reduce the risk of developing this condition, including: Staying at a healthy weight. Getting regular exercise. Managing stress. Contact a health care provider if: Your symptoms are not improving with home care. You have signs or symptoms of PFD that get worse. You develop new signs or symptoms. You have signs of a urinary tract infection, such as: Fever. Chills. Increased urinary frequency. A burning feeling when urinating. You have not had a bowel movement in 3 days (constipation). Summary Pelvic floor dysfunction results when the muscles and connective tissues in your pelvic floor do not work well. These muscles and their connections form a sling that supports your colon and bladder. In men, these muscles also support the prostate gland. PFD may be caused by an injury to the pelvic area or by a weakening of pelvic muscles. PFD causes pelvic floor muscles to be too weak, too tight, or a combination of both. Symptoms may vary from person to person. In most cases, PFD can be treated with physical therapies and medicines. Surgery may be an option if other treatments do not help. This information is not intended to replace advice given to you by your health care provider. Make sure you discuss any questions you have with your health care provider. Document Revised: 02/07/2021 Document Reviewed: 02/07/2021 Elsevier Patient Education  2024 [employer]   Pre-Vasectomy Instructions  STOP all aspirin  or blood thinners (Aspirin , Plavix, Coumadin, Warfarin, Motrin , Ibuprofen , Advil , Aleve, Naproxen, Naprosyn) for 7 days prior to the procedure.  If you have any questions about stopping these medications please contact your primary care physician or  cardiologist.  Shave all hair from the upper scrotum on the day of the procedure.  This means just under the penis onto the scrotal sac.  The area shaved should measure about 2-3 inches around.  You may lather the scrotum with soap and water, and shave with a safety razor.  After shaving the area, thoroughly wash the penis and the scrotum, then shower or bathe to remove all the loose hairs.  If needed, wash the area again just before coming in for your Vasectomy.  It is recommended to have a light meal an hour or so prior to the procedure.  Bring a scrotal support (jock strap or suspensory, or tight jockey shorts or underwear).  Wear comfortable pants or shorts.  While the actual procedure usually takes about 20 minutes, you should be prepared to stay in the office for approximately one hour.  Bring someone with you to drive you home.  If you have any questions or concerns, please feel free to call the office at [phone].  Vasectomy Vasectomy is a procedure in which the vas deferens is cut and then tied or burned (cauterized). The vas deferens is a tube that carries sperm from the testicle to the part of the body that drains urine from the bladder (urethra). This procedure blocks sperm from going through the vas deferens and penis during ejaculation. This ensures that sperm does not go into the vagina during sex. Vasectomy does not affect sexual desire or performance and does not prevent sexually transmitted infections. Vasectomy is considered a permanent and very effective form of birth control (contraception). The decision to have a  vasectomy should not be made during a stressful time, such as after the loss of a pregnancy or a divorce. You and your partner should decide on whether to have a vasectomy when you are sure that you do not want children in the future. Tell a health care provider about: Any allergies you have. All medicines you are taking, including vitamins, herbs, eye drops,  creams, and over-the-counter medicines. Any problems you or family members have had with anesthetic medicines. Any blood disorders you have. Any surgeries you have had. Any medical conditions you have. What are the risks? Generally, this is a safe procedure. However, problems may occur, including: Infection. Bleeding and swelling of the scrotum. The scrotum is the sac that contains the testicles, blood vessels, and structures that help deliver sperm and semen. Allergic reactions to medicines. Failure of the procedure to prevent pregnancy. There is a very small chance that the tied or cauterized ends of the vas deferens may reconnect (recanalization). If this happens, you could still make a woman pregnant. Pain in the scrotum that continues after you heal from the procedure. What happens before the procedure? Medicines Ask your health care provider about: Changing or stopping your regular medicines. This is especially important if you are taking diabetes medicines or blood thinners. Taking medicines such as aspirin  and ibuprofen . These medicines can thin your blood. Do not take these medicines unless your health care provider tells you to take them. Taking over-the-counter medicines, vitamins, herbs, and supplements. You may be told to take a medicine to help you relax (sedative) a few hours before the procedure. General instructions Do not use any products that contain nicotine  or tobacco for at least 4 weeks before the procedure. These products include cigarettes, e-cigarettes, and chewing tobacco. If you need help quitting, ask your health care provider. Plan to have a responsible adult take you home from the hospital or clinic. If you will be going home right after the procedure, plan to have a responsible adult care for you for the time you are told. This is important. Ask your health care provider: How your surgery site will be marked. What steps will be taken to help prevent infection.  These steps may include: Removing hair at the surgery site. Washing skin with a germ-killing soap. Taking antibiotic medicine. What happens during the procedure?  You will be given one or more of the following: A sedative, unless you were told to take this a few hours before the procedure. A medicine to numb the area (local anesthetic). Your health care provider will feel, or palpate, for your vas deferens. To reach the vas deferens, one of two methods may be used: A very small incision may be made in your scrotum. A punctured opening may be made in your scrotum, without an incision. Your vas deferens will be pulled out of your scrotum and cut. Then, the vas deferens will be closed in one of two ways: Tied at the ends. Cauterized at the ends to seal them off. The vas deferens will be put back into your scrotum. The incision or puncture opening will be closed with absorbable stitches (sutures). The sutures will eventually dissolve and will not need to be removed after the procedure. The procedure will be repeated on the other side of your scrotum. The procedure may vary among health care providers and hospitals. What happens after the procedure? You will be monitored to make sure that you do not have problems. You will be asked not to ejaculate  for at least 1 week after the procedure, or for as Valentino as you are told. You will need to use a different form of contraception for 2-4 months after the procedure, until you have test results confirming that there are no sperm in your semen. You may be given scrotal support to wear, such as a jockstrap or underwear with a supportive pouch. If you were given a sedative during the procedure, it can affect you for several hours. Do not drive or operate machinery until your health care provider says that it is safe. Summary Vasectomy blocks sperm from being released during ejaculation. This procedure is considered a permanent and very effective form of  birth control. Your scrotum will be numbed with medicine (local anesthetic) for the procedure. After the procedure, you will be asked not to ejaculate for at least 1 week, or for as Caltagirone as you are told. You will also need to use a different form of contraception until your test results confirm that there are no sperm in your semen. This information is not intended to replace advice given to you by your health care provider. Make sure you discuss any questions you have with your health care provider. Document Revised: 02/17/2020 Document Reviewed: 02/17/2020 Elsevier Patient Education  2023 [employer] Inc.  Vasectomy, Care After This sheet gives you information about how to care for yourself after your procedure. Your health care provider may also give you more specific instructions. If you have problems or questions, contact your health care provider. What can I expect after the procedure? After the procedure, it is common to have: Mild pain, swelling, or discomfort in your scrotum or redness on your scrotum. Some blood coming from your incisions or puncture sites for 1 or 2 days. Blood in your semen. Follow these instructions at home: Medicines Take over-the-counter and prescription medicines only as told by your health care provider. Avoid taking any medicines that contain aspirin  or NSAIDs, such as ibuprofen . These medicines can make bleeding worse. Activity For the first 2 days after surgery, avoid physical activity and exercise that requires a lot of energy. Ask your health care provider what activities are safe for you. Do not take part in sports or perform heavy physical labor until your pain has improved, or until your health care provider says it is okay. You may have limits on the amount of weight you can lift as told by your health care provider. Do not ejaculate for at least 1 week after the procedure, or for as Richardson as you are told. You may resume sexual activity 7-10 days after  your procedure, or when your health care provider approves. Use a different method of birth control (contraception) until you have had test results that confirm that there is no sperm in your semen. Scrotal support Use scrotal support, such as a jockstrap or underwear with a supportive pouch, as needed for 1 week after your procedure. If you feel discomfort in your scrotum, you may remove the scrotal support to see if the discomfort is relieved. Sometimes scrotal support can press on the scrotum and cause or worsen discomfort. If your skin gets irritated, you may add some germ-free (sterile), fluffed bandages or a clean washcloth to the scrotal support. Managing pain and swelling If directed, put ice on the affected area. To do this: Put ice in a plastic bag. Place a towel between your skin and the bag. Leave the ice on for 15 minutes, 3-4 times a day. Remove the ice  if your skin turns bright red. This is very important. If you cannot feel pain, heat, or cold, you have a greater risk of damage to the area.  General instructions  Check your incisions or puncture sites every day for signs of infection. Check for: Redness, swelling, or more pain. Fluid or blood. Warmth. Pus or a bad smell. Leave stitches (sutures) in place. The sutures will dissolve on their own and do not need to be removed. Keep all follow-up visits. This is important because you will need a test to confirm that there is no sperm in your semen. Multiple ejaculations are needed to clear out sperm that were beyond the vasectomy site. You will need one test result showing that there is no sperm in your semen before you can resume unprotected sex. This may take 2-4 months after your procedure. If you were given a sedative during the procedure, it can affect you for several hours. Do not drive or operate machinery until your health care provider says that it is safe. Contact a health care provider if: You have redness, swelling, or  more pain around an incision or puncture site, or in your scrotum area. You have bleeding from an incision or puncture site. You have pus or a bad smell coming from an incision or puncture site. You have a fever. An incision or puncture site opens up. Get help right away if: You develop a rash. You have trouble breathing. Summary After your procedure, it is common to have mild pain, swelling, redness, or discomfort in your scrotum. For the first 2 days after surgery, avoid physical activity and exercise that requires a lot of energy. Put ice on the affected area. Leave the ice on for 20 minutes, 2-3 times a day. If you were given a sedative during the procedure, it can affect you for several hours. Do not drive or operate machinery until your health care provider says that it is safe. This information is not intended to replace advice given to you by your health care provider. Make sure you discuss any questions you have with your health care provider. Document Revised: 02/17/2020 Document Reviewed: 02/17/2020 Elsevier Patient Education  2023 [employer].

## 2024-05-24 ENCOUNTER — Telehealth: Payer: Self-pay

## 2024-05-24 ENCOUNTER — Ambulatory Visit (INDEPENDENT_AMBULATORY_CARE_PROVIDER_SITE_OTHER)

## 2024-05-24 VITALS — BP 130/80 | HR 87 | Ht 71.0 in | Wt 209.4 lb

## 2024-05-24 DIAGNOSIS — B009 Herpesviral infection, unspecified: Secondary | ICD-10-CM

## 2024-05-24 DIAGNOSIS — N411 Chronic prostatitis: Secondary | ICD-10-CM | POA: Diagnosis not present

## 2024-05-24 NOTE — Progress Notes (Signed)
            Progress Note  Physician: Taygen Newsome A Leonidus Rowand, MD   HPI: Brett [name] is a 43 y.o. [name] presenting on 05/24/2024 for Medical Management of Chronic Issues .   History of Present Illness         Patient seen in follow-up.  At the last visit he had been having issues with chronic prostatitis.  He was seen in follow-up by urology and given Celebrex  to use and some pelvic floor exercises.  He will be scheduled for vasectomy with urology as well.  He feels that his symptoms are stable currently.  Patient otherwise.  Labs done these are a complete and he will do them today.  We did provide him with valacyclovir  1000 mg to use on a as needed basis for genital herpes.  He is using some 2 days since we have last visited and this seems to be working well for him.  Otherwise he has a low-dose CT pending    Medical history:  Relevant past medical, surgical, family and social history reviewed and updated as indicated. Interim medical history since our last visit reviewed.  Allergies and medications reviewed and updated.   ROS: Negative unless specifically indicated above in HPI.    Current Outpatient Medications:    celecoxib  (CELEBREX ) 200 MG capsule, Take 1 capsule (200 mg total) by mouth 2 (two) times daily., Disp: 20 capsule, Rfl: 0   diazepam  (VALIUM ) 10 MG tablet, Take 1 tablet (10 mg total) by mouth once as needed for up to 1 dose (take 1 hour prior to vasectomy)., Disp: 1 tablet, Rfl: 0       Objective:     BP 130/80 (BP Location: Left Arm, Patient Position: Sitting, Cuff Size: Normal)   Pulse 87   Ht 5' 11 (1.803 m)   Wt 209 lb 6 oz (95 kg)   SpO2 95%   BMI 29.20 kg/m   Wt Readings from Last 3 Encounters:  05/24/24 209 lb 6 oz (95 kg)  05/20/24 209 lb (94.8 kg)  04/26/24 205 lb (93 kg)    Physical Exam  Physical Exam Vitals reviewed.  Constitutional:      Appearance: Normal appearance. Well-developed with normal weight.  Cardiovascular:      Rate and Rhythm: Normal rate and regular rhythm. Normal heart sounds. Normal peripheral pulses Pulmonary:     Normal breath sounds with normal effort Skin:    General: Skin is warm and dry without noticeable rash. Neurological:     General: No focal deficit present.  Psychiatric:        Mood and Affect: Mood, behavior and cognition normal      Assessment & Plan:  No diagnosis found.  No orders of the defined types were placed in this encounter.    Assessment and Plan      #1 chronic prostatitis.  Ongoing follow-up with urology.  Seems like there was a discussion regarding pelvic floor disc function and chronic bladder pain.  Continue with Celebrex  as per urology recommendations.  2.  Genital herpes.  Right use of valacyclovir  1000 mg as needed appropriate.  This seems to be stable for the time being.  F/u if frequency of outbreaks increases.   Patient to finish lab workup.  He had some issues regarding bilateral forearm pain and numbness and so we will see him for this at his next visit and review labs.

## 2024-05-24 NOTE — Telephone Encounter (Signed)
 Opened in error

## 2024-06-01 ENCOUNTER — Other Ambulatory Visit: Payer: Self-pay

## 2024-06-02 LAB — URINALYSIS, COMPLETE
Bilirubin, UA: NEGATIVE
Glucose, UA: NEGATIVE
Ketones, UA: NEGATIVE
Leukocytes,UA: NEGATIVE
Nitrite, UA: NEGATIVE
Protein,UA: NEGATIVE
RBC, UA: NEGATIVE
Specific Gravity, UA: 1.02 (ref 1.005–1.030)
Urobilinogen, Ur: 0.2 mg/dL (ref 0.2–1.0)
pH, UA: 5.5 (ref 5.0–7.5)

## 2024-06-02 LAB — CBC WITH DIFFERENTIAL/PLATELET
Basophils Absolute: 0.1 x10E3/uL (ref 0.0–0.2)
Basos: 1 %
EOS (ABSOLUTE): 0.3 x10E3/uL (ref 0.0–0.4)
Eos: 5 %
Hematocrit: 45.4 % (ref 37.5–51.0)
Hemoglobin: 15.4 g/dL (ref 13.0–17.7)
Immature Grans (Abs): 0 x10E3/uL (ref 0.0–0.1)
Immature Granulocytes: 0 %
Lymphocytes Absolute: 1.5 x10E3/uL (ref 0.7–3.1)
Lymphs: 22 %
MCH: 32.6 pg (ref 26.6–33.0)
MCHC: 33.9 g/dL (ref 31.5–35.7)
MCV: 96 fL (ref 79–97)
Monocytes Absolute: 0.5 x10E3/uL (ref 0.1–0.9)
Monocytes: 7 %
Neutrophils Absolute: 4.4 x10E3/uL (ref 1.4–7.0)
Neutrophils: 65 %
Platelets: 226 x10E3/uL (ref 150–450)
RBC: 4.72 x10E6/uL (ref 4.14–5.80)
RDW: 11.9 % (ref 11.6–15.4)
WBC: 6.8 x10E3/uL (ref 3.4–10.8)

## 2024-06-02 LAB — IRON AND TIBC
Iron Saturation: 38 % (ref 15–55)
Iron: 132 ug/dL (ref 38–169)
Total Iron Binding Capacity: 351 ug/dL (ref 250–450)
UIBC: 219 ug/dL (ref 111–343)

## 2024-06-02 LAB — MICROSCOPIC EXAMINATION
Bacteria, UA: NONE SEEN
Casts: NONE SEEN /LPF
Epithelial Cells (non renal): NONE SEEN /HPF (ref 0–10)
RBC, Urine: NONE SEEN /HPF (ref 0–2)
WBC, UA: NONE SEEN /HPF (ref 0–5)

## 2024-06-02 LAB — FERRITIN: Ferritin: 241 ng/mL (ref 30–400)

## 2024-06-02 LAB — TSH+FREE T4
Free T4: 1.12 ng/dL (ref 0.82–1.77)
TSH: 1.37 u[IU]/mL (ref 0.450–4.500)

## 2024-06-02 LAB — PSA: Prostate Specific Ag, Serum: 0.5 ng/mL (ref 0.0–4.0)

## 2024-06-02 LAB — LIPOPROTEIN A (LPA): Lipoprotein (a): 41.5 nmol/L (ref <75.0)

## 2024-06-02 LAB — HGB A1C W/O EAG: Hgb A1c MFr Bld: 5.6 % (ref 4.8–5.6)

## 2024-06-03 ENCOUNTER — Ambulatory Visit (INDEPENDENT_AMBULATORY_CARE_PROVIDER_SITE_OTHER)

## 2024-06-03 VITALS — BP 144/90 | HR 68 | Ht 71.0 in | Wt 212.5 lb

## 2024-06-03 DIAGNOSIS — G5623 Lesion of ulnar nerve, bilateral upper limbs: Secondary | ICD-10-CM | POA: Insufficient documentation

## 2024-06-03 DIAGNOSIS — F17200 Nicotine dependence, unspecified, uncomplicated: Secondary | ICD-10-CM | POA: Diagnosis not present

## 2024-06-03 MED ORDER — BUPROPION HCL ER (SR) 150 MG PO TB12: 150.0000 mg | ORAL_TABLET | Freq: Two times a day (BID) | ORAL | 2 refills | Status: AC

## 2024-06-03 NOTE — Progress Notes (Signed)
            Progress Note  Physician: Shenia Alan A Doristine Shehan, MD   HPI: Brett [name] is a 43 y.o. male presenting on 06/03/2024 for Follow-up .  Discussed the use of AI scribe software for clinical note transcription with the patient, who gave verbal consent to proceed.  History of Present Illness             Medical history:  Relevant past medical, surgical, family and social history reviewed and updated as indicated. Interim medical history since our last visit reviewed.  Allergies and medications reviewed and updated.   ROS: Negative unless specifically indicated above in HPI.    Current Outpatient Medications:    celecoxib  (CELEBREX ) 200 MG capsule, Take 1 capsule (200 mg total) by mouth 2 (two) times daily., Disp: 20 capsule, Rfl: 0   diazepam  (VALIUM ) 10 MG tablet, Take 1 tablet (10 mg total) by mouth once as needed for up to 1 dose (take 1 hour prior to vasectomy)., Disp: 1 tablet, Rfl: 0       Objective:     BP (!) 144/90 (BP Location: Right Arm, Patient Position: Sitting, Cuff Size: Large)   Pulse 68   Ht 5' 11 (1.803 m)   Wt 212 lb 8 oz (96.4 kg)   SpO2 98%   BMI 29.64 kg/m   Wt Readings from Last 3 Encounters:  06/03/24 212 lb 8 oz (96.4 kg)  05/24/24 209 lb 6 oz (95 kg)  05/20/24 209 lb (94.8 kg)    Physical Exam  Physical Exam Vitals reviewed.  Constitutional:      Appearance: Normal appearance. Well-developed with normal weight.  Cardiovascular:     Rate and Rhythm: Normal rate and regular rhythm. Normal heart sounds. Normal peripheral pulses Pulmonary:     Normal breath sounds with normal effort Skin:    General: Skin is warm and dry without noticeable rash. Neurological:     General: No focal deficit present.  Psychiatric:        Mood and Affect: Mood, behavior and cognition normal      Assessment & Plan:  No diagnosis found.  No orders of the defined types were placed in this encounter.    Assessment and Plan

## 2024-06-08 LAB — COMPREHENSIVE METABOLIC PANEL WITH GFR
ALT: 15 IU/L (ref 0–44)
AST: 19 IU/L (ref 0–40)
Albumin: 4.2 g/dL (ref 4.1–5.1)
Alkaline Phosphatase: 67 IU/L (ref 44–121)
BUN/Creatinine Ratio: 11 (ref 9–20)
BUN: 12 mg/dL (ref 6–24)
Bilirubin Total: 0.4 mg/dL (ref 0.0–1.2)
CO2: 20 mmol/L (ref 20–29)
Calcium: 9.1 mg/dL (ref 8.7–10.2)
Chloride: 103 mmol/L (ref 96–106)
Creatinine, Ser: 1.12 mg/dL (ref 0.76–1.27)
Globulin, Total: 2.5 g/dL (ref 1.5–4.5)
Glucose: 101 mg/dL — ABNORMAL HIGH (ref 70–99)
Potassium: 5 mmol/L (ref 3.5–5.2)
Sodium: 139 mmol/L (ref 134–144)
Total Protein: 6.7 g/dL (ref 6.0–8.5)
eGFR: 84 mL/min/1.73 (ref >59)

## 2024-06-08 LAB — SPECIMEN STATUS REPORT

## 2024-06-24 ENCOUNTER — Encounter: Admitting: Urology

## 2024-08-18 ENCOUNTER — Telehealth: Payer: Self-pay

## 2024-08-18 NOTE — Telephone Encounter (Signed)
 Have been trying to call patient regarding VAS Billing Details without success.

## 2024-08-20 ENCOUNTER — Encounter: Admitting: Urology

## 2024-08-20 DIAGNOSIS — Z9852 Vasectomy status: Secondary | ICD-10-CM

## 2024-09-04 ENCOUNTER — Telehealth: Admitting: Family Medicine

## 2024-09-04 DIAGNOSIS — J069 Acute upper respiratory infection, unspecified: Secondary | ICD-10-CM | POA: Diagnosis not present

## 2024-09-04 MED ORDER — AZITHROMYCIN 250 MG PO TABS: ORAL_TABLET | ORAL | 0 refills | Status: AC

## 2024-09-04 MED ORDER — PREDNISONE 10 MG (21) PO TBPK: ORAL_TABLET | ORAL | 0 refills | Status: DC

## 2024-09-04 NOTE — Progress Notes (Signed)
 Virtual Visit Consent   Brett [name], you are scheduled for a virtual visit with a [facility] provider today. Just as with appointments in the office, your consent must be obtained to participate. Your consent will be active for this visit and any virtual visit you may have with one of our providers in the next 365 days. If you have a MyChart account, a copy of this consent can be sent to you electronically.  As this is a virtual visit, video technology does not allow for your provider to perform a traditional examination. This may limit your provider's ability to fully assess your condition. If your provider identifies any concerns that need to be evaluated in person or the need to arrange testing (such as labs, EKG, etc.), we will make arrangements to do so. Although advances in technology are sophisticated, we cannot ensure that it will always work on either your end or our end. If the connection with a video visit is poor, the visit may have to be switched to a telephone visit. With either a video or telephone visit, we are not always able to ensure that we have a secure connection.  By engaging in this virtual visit, you consent to the provision of healthcare and authorize for your insurance to be billed (if applicable) for the services provided during this visit. Depending on your insurance coverage, you may receive a charge related to this service.  I need to obtain your verbal consent now. Are you willing to proceed with your visit today? Meghan Tiemann Betsill has provided verbal consent on 09/04/2024 for a virtual visit (video or telephone). Brett [name], [place]  Date: 09/04/2024 9:29 AM   Virtual Visit via Video Note   I, Brett [name], connected with  Garnie Borchardt Torre  ([id number], Mar 26, 1981) on 09/04/24 at  9:15 AM EST by a video-enabled telemedicine application and verified that I am speaking with the correct person using two identifiers.  Location: Patient: Virtual Visit  Location Patient: Home Provider: Virtual Visit Location Provider: Home Office   I discussed the limitations of evaluation and management by telemedicine and the availability of in person appointments. The patient expressed understanding and agreed to proceed.    History of Present Illness: Brett [name] is a 43 y.o. who identifies as a male who was assigned male at birth, and is being seen today for c/o having a cold that started Wednesday and taking Mucinex  and Tylenol  cold. Pt states he is not getting better but is worsening.  Pt states he gets a lot of sinus infections.  Pt states the over th counter medication is not helpful. Pt denies fever.  Pt states he is coughing up mucus and has thick mucus. Pt does not report sick contacts. Pt states when he gets theses kinds of infections he is out of work for 2-3 days as they become severe. Pt states he is traveling on Monday and is wanting to make sure he is better before he flies.   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 150 mg for three days, then twice daily, Disp: 90 tablet, Rfl: 2    celecoxib  (CELEBREX ) 200 MG capsule, Take 1 capsule (200 mg total) by mouth 2 (two) times daily., Disp: 20 capsule, Rfl: 0   diazepam  (VALIUM ) 10 MG tablet, Take 1 tablet (10 mg total) by mouth once as needed for up to 1 dose (take 1 hour prior to vasectomy)., Disp: 1 tablet, Rfl: 0  Observations/Objective: Patient is well-developed, well-nourished in no acute distress.  Resting comfortably at home.  Head is normocephalic, atraumatic.  No labored breathing.  Speech is clear and coherent with logical content.  Patient is alert and oriented at baseline.    Assessment and Plan: 1. Upper respiratory tract infection, unspecified type (Primary) - predniSONE  (STERAPRED UNI-PAK 21 TAB) 10 MG (21) TBPK tablet; Take following package directions.  Dispense: 21 tablet; Refill: 0 - azithromycin  (ZITHROMAX ) 250 MG tablet; Take 2 tablets on day 1, then 1 tablet daily on days 2 through 5  Dispense: 6 tablet; Refill: 0  -Given history of symptoms and progressively becoming worse, will treat with Abx and prednisone .  -Pt to follow up with PCP or urgent care for worsening symptoms.   Follow Up Instructions: I discussed the assessment and treatment plan with the patient. The patient was provided an opportunity to ask questions and all were answered. The patient agreed with the plan and demonstrated an understanding of the instructions.  A copy of instructions were sent to the patient via MyChart unless otherwise noted below.     The patient was advised to call back or seek an in-person evaluation if the symptoms worsen or if the condition fails to improve as anticipated.    Brett Mater, PA-C

## 2024-09-04 NOTE — Patient Instructions (Signed)
 Brett [name], thank you for joining [name], PA-C for today's virtual visit.  While this provider is not your primary care provider (PCP), if your PCP is located in our provider database this encounter information will be shared with them immediately following your visit.   A [facility] MyChart account gives you access to today's visit and all your visits, tests, and labs performed at [facility]  click here if you don't have a [facility] MyChart account or go to [email or web address]  Consent: (Patient) Brett [name] provided verbal consent for this virtual visit at the beginning of the encounter.  Current Medications:  Current Outpatient Medications:    azithromycin  (ZITHROMAX ) 250 MG tablet, Take 2 tablets on day 1, then 1 tablet daily on days 2 through 5, Disp: 6 tablet, Rfl: 0   predniSONE  (STERAPRED UNI-PAK 21 TAB) 10 MG (21) TBPK tablet, Take following package directions., Disp: 21 tablet, Rfl: 0   buPROPion  (WELLBUTRIN  SR) 150 MG 12 hr tablet, Take 1 tablet (150 mg total) by mouth 2 (two) times daily. 150 mg for three days, then twice daily, Disp: 90 tablet, Rfl: 2   celecoxib  (CELEBREX ) 200 MG capsule, Take 1 capsule (200 mg total) by mouth 2 (two) times daily., Disp: 20 capsule, Rfl: 0   diazepam  (VALIUM ) 10 MG tablet, Take 1 tablet (10 mg total) by mouth once as needed for up to 1 dose (take 1 hour prior to vasectomy)., Disp: 1 tablet, Rfl: 0   Medications ordered in this encounter:  Meds ordered this encounter  Medications   predniSONE  (STERAPRED UNI-PAK 21 TAB) 10 MG (21) TBPK tablet    Sig: Take following package directions.    Dispense:  21 tablet    Refill:  0    Please dispense one standard blister pack taper.   azithromycin  (ZITHROMAX ) 250 MG tablet    Sig: Take 2 tablets on day 1, then 1 tablet daily on days 2 through 5    Dispense:  6 tablet    Refill:  0     *If you need refills on other medications prior to your next  appointment, please contact your pharmacy*  Follow-Up: Call back or seek an in-person evaluation if the symptoms worsen or if the condition fails to improve as anticipated.  [facility] [phone]  Other Instructions Upper Respiratory Infection, Adult An upper respiratory infection (URI) is a common viral infection of the nose, throat, and upper air passages that lead to the lungs. The most common type of URI is the common cold. URIs usually get better on their own, without medical treatment. What are the causes? A URI is caused by a virus. You may catch a virus by: Breathing in droplets from an infected person's cough or sneeze. Touching something that has been exposed to the virus (is contaminated) and then touching your mouth, nose, or eyes. What increases the risk? You are more likely to get a URI if: You are very young or very old. You have close contact with others, such as at work, school, or a health care facility. You smoke. You have [place]-term (chronic) heart or lung disease. You have a weakened disease-fighting system (immune system). You have nasal allergies or asthma. You are experiencing a lot of stress. You have poor nutrition. What are the signs or symptoms? A URI usually involves some of the following symptoms: Runny or stuffy (congested) nose. Cough. Sneezing. Sore throat. Headache. Fatigue. Fever. Loss of appetite. Pain in your  forehead, behind your eyes, and over your cheekbones (sinus pain). Muscle aches. Redness or irritation of the eyes. Pressure in the ears or face. How is this diagnosed? This condition may be diagnosed based on your medical history and symptoms, and a physical exam. Your health care provider may use a swab to take a mucus sample from your nose (nasal swab). This sample can be tested to determine what virus is causing the illness. How is this treated? URIs usually get better on their own within 7-10 days. Medicines  cannot cure URIs, but your health care provider may recommend certain medicines to help relieve symptoms, such as: Over-the-counter cold medicines. Cough suppressants. Coughing is a type of defense against infection that helps to clear the respiratory system, so take these medicines only as recommended by your health care provider. Fever-reducing medicines. Follow these instructions at home: Activity Rest as needed. If you have a fever, stay home from work or school until your fever is gone or until your health care provider says your URI cannot spread to other people (is no longer contagious). Your health care provider may have you wear a face mask to prevent your infection from spreading. Relieving symptoms Gargle with a mixture of salt and water 3-4 times a day or as needed. To make salt water, completely dissolve -1 tsp (3-6 g) of salt in 1 cup (237 mL) of warm water. Use a cool-mist humidifier to add moisture to the air. This can help you breathe more easily. Eating and drinking  Drink enough fluid to keep your urine pale yellow. Eat soups and other clear broths. General instructions  Take over-the-counter and prescription medicines only as told by your health care provider. These include cold medicines, fever reducers, and cough suppressants. Do not use any products that contain nicotine  or tobacco. These products include cigarettes, chewing tobacco, and vaping devices, such as e-cigarettes. If you need help quitting, ask your health care provider. Stay away from secondhand smoke. Stay up to date on all immunizations, including the yearly (annual) flu vaccine. Keep all follow-up visits. This is important. How to prevent the spread of infection to others URIs can be contagious. To prevent the infection from spreading: Wash your hands with soap and water for at least 20 seconds. If soap and water are not available, use hand sanitizer. Avoid touching your mouth, face, eyes, or  nose. Cough or sneeze into a tissue or your sleeve or elbow instead of into your hand or into the air.  Contact a health care provider if: You are getting worse instead of better. You have a fever or chills. Your mucus is brown or red. You have yellow or brown discharge coming from your nose. You have pain in your face, especially when you bend forward. You have swollen neck glands. You have pain while swallowing. You have white areas in the back of your throat. Get help right away if: You have shortness of breath that gets worse. You have severe or persistent: Headache. Ear pain. Sinus pain. Chest pain. You have chronic lung disease along with any of the following: Making high-pitched whistling sounds when you breathe, most often when you breathe out (wheezing). Prolonged cough (more than 14 days). Coughing up blood. A change in your usual mucus. You have a stiff neck. You have changes in your: Vision. Hearing. Thinking. Mood. These symptoms may be an emergency. Get help right away. Call 911. Do not wait to see if the symptoms will go away. Do not drive  yourself to the hospital. Summary An upper respiratory infection (URI) is a common infection of the nose, throat, and upper air passages that lead to the lungs. A URI is caused by a virus. URIs usually get better on their own within 7-10 days. Medicines cannot cure URIs, but your health care provider may recommend certain medicines to help relieve symptoms. This information is not intended to replace advice given to you by your health care provider. Make sure you discuss any questions you have with your health care provider. Document Revised: 05/02/2021 Document Reviewed: 05/02/2021 Elsevier Patient Education  2024 Elsevier Inc.   If you have been instructed to have an in-person evaluation today at a local Urgent Care facility, please use the link below. It will take you to a list of all of our available [facility], including address, phone number and hours of operation. Please do not delay care.  [facility]  If you or a family member do not have a primary care provider, use the link below to schedule a visit and establish care. When you choose a [facility] primary care physician or advanced practice provider, you gain a [place]-term partner in health. Find a Primary Care Provider  Learn more about [facility] in-office and virtual care options: [facility] - Get Care Now

## 2024-11-05 ENCOUNTER — Telehealth (INDEPENDENT_AMBULATORY_CARE_PROVIDER_SITE_OTHER)

## 2024-11-05 DIAGNOSIS — J0100 Acute maxillary sinusitis, unspecified: Secondary | ICD-10-CM | POA: Diagnosis not present

## 2024-11-05 DIAGNOSIS — J019 Acute sinusitis, unspecified: Secondary | ICD-10-CM | POA: Insufficient documentation

## 2024-11-05 MED ORDER — DOXYCYCLINE HYCLATE 100 MG PO TABS: 100.0000 mg | ORAL_TABLET | Freq: Two times a day (BID) | ORAL | 0 refills | Status: AC

## 2024-11-05 MED ORDER — PREDNISONE 10 MG PO TABS: 30.0000 mg | ORAL_TABLET | Freq: Every day | ORAL | 0 refills | Status: AC

## 2024-11-05 NOTE — Progress Notes (Signed)
" ° ° °          Progress Note  Physician: Tavon Magnussen A Gianfranco Araki, MD   Patient contacted 11/05/24 at  1:40 PM EST by a video enabled telemedicine application and verified that I am speaking with the correct person.   Patient is aware of limitations of evaluation by telemedicine The patient expressed understanding and agreed to proceed.   HPI: Brett [name] is a 44 y.o. male presenting on 11/05/2024 for No chief complaint on file. . Patient seen for evaluation of acute sinusitis.  He has had a more recent tooth abscess which was treated and for which he had a course of Augmentin.  Patient thereafter for the last days has had more sinus pain and pressure.  This is not related to his tooth abscess.  He then began having purulent sinus drainage.  He does have a current history of acute sinusitis and has been treated for this in the past  No fever chills or other symptoms.  He is tolerating oral intake has been using Flonase  and NSAIDs as needed.    Social history:  Relevant past medical, surgical, family and social history reviewed and updated as indicated. Interim medical history since our last visit reviewed.  Allergies and medications reviewed and updated.  DATA REVIEWED: CHART IN EPIC  ROS: Negative unless specifically indicated above in HPI.   Current Medications[1]      Objective:  Telephone visit     Assessment & Plan:  Acute non-recurrent maxillary sinusitis  #1 acute sinusitis.  Patient may use doxycycline  100 mg twice daily x 7 days.  Continue with Flonase  he may flush with saline rinse.  He is requesting prednisone  and I will give him a small amount simply for symptomatic relief although studies show that this is not particularly efficacious.  Follow up if new symptoms develop fever chills or other complications.  In regard to his tooth abscess this has been checked by his dentist and has been determined to be healed without any association with his sinus  infection  No follow-ups on file.     [1]  Current Outpatient Medications:    buPROPion  (WELLBUTRIN  SR) 150 MG 12 hr tablet, Take 1 tablet (150 mg total) by mouth 2 (two) times daily. 150 mg for three days, then twice daily, Disp: 90 tablet, Rfl: 2   celecoxib  (CELEBREX ) 200 MG capsule, Take 1 capsule (200 mg total) by mouth 2 (two) times daily., Disp: 20 capsule, Rfl: 0  "

## 2025-01-03 ENCOUNTER — Ambulatory Visit
